# Patient Record
Sex: Female | Born: 1965 | Race: Black or African American | Hispanic: No | Smoking: Former smoker
Health system: Southern US, Community
[De-identification: ages and names within clinical notes are randomized; demographics above are authoritative.]

## PROBLEM LIST (undated history)

## (undated) DIAGNOSIS — D649 Anemia, unspecified: Secondary | ICD-10-CM

## (undated) DIAGNOSIS — L309 Dermatitis, unspecified: Secondary | ICD-10-CM

## (undated) DIAGNOSIS — D332 Benign neoplasm of brain, unspecified: Secondary | ICD-10-CM

## (undated) DIAGNOSIS — R51 Headache: Secondary | ICD-10-CM

## (undated) DIAGNOSIS — K219 Gastro-esophageal reflux disease without esophagitis: Secondary | ICD-10-CM

## (undated) DIAGNOSIS — I10 Essential (primary) hypertension: Secondary | ICD-10-CM

## (undated) DIAGNOSIS — R011 Cardiac murmur, unspecified: Secondary | ICD-10-CM

## (undated) HISTORY — PX: WISDOM TOOTH EXTRACTION: SHX21

## (undated) HISTORY — PX: ABDOMINAL HYSTERECTOMY: SHX81

---

## 1997-12-11 ENCOUNTER — Other Ambulatory Visit: Admission: RE | Admit: 1997-12-11 | Discharge: 1997-12-11 | Payer: Self-pay | Admitting: Obstetrics and Gynecology

## 1999-11-19 ENCOUNTER — Other Ambulatory Visit: Admission: RE | Admit: 1999-11-19 | Discharge: 1999-11-19 | Payer: Self-pay | Admitting: *Deleted

## 2000-01-21 HISTORY — PX: TUBAL LIGATION: SHX77

## 2000-09-18 ENCOUNTER — Encounter: Admission: RE | Admit: 2000-09-18 | Discharge: 2000-12-17 | Payer: Self-pay | Admitting: *Deleted

## 2000-12-02 ENCOUNTER — Encounter (HOSPITAL_COMMUNITY): Admission: RE | Admit: 2000-12-02 | Discharge: 2000-12-18 | Payer: Self-pay | Admitting: Obstetrics and Gynecology

## 2000-12-17 ENCOUNTER — Inpatient Hospital Stay (HOSPITAL_COMMUNITY): Admission: AD | Admit: 2000-12-17 | Discharge: 2000-12-20 | Payer: Self-pay | Admitting: Obstetrics and Gynecology

## 2000-12-28 ENCOUNTER — Encounter: Payer: Self-pay | Admitting: Obstetrics and Gynecology

## 2000-12-28 ENCOUNTER — Ambulatory Visit (HOSPITAL_COMMUNITY): Admission: RE | Admit: 2000-12-28 | Discharge: 2000-12-28 | Payer: Self-pay | Admitting: Obstetrics and Gynecology

## 2001-01-15 ENCOUNTER — Other Ambulatory Visit: Admission: RE | Admit: 2001-01-15 | Discharge: 2001-01-15 | Payer: Self-pay | Admitting: Obstetrics and Gynecology

## 2001-02-04 ENCOUNTER — Ambulatory Visit (HOSPITAL_COMMUNITY): Admission: RE | Admit: 2001-02-04 | Discharge: 2001-02-04 | Payer: Self-pay | Admitting: Obstetrics and Gynecology

## 2002-01-04 ENCOUNTER — Other Ambulatory Visit: Admission: RE | Admit: 2002-01-04 | Discharge: 2002-01-04 | Payer: Self-pay | Admitting: Obstetrics and Gynecology

## 2002-10-04 ENCOUNTER — Ambulatory Visit (HOSPITAL_COMMUNITY): Admission: RE | Admit: 2002-10-04 | Discharge: 2002-10-04 | Payer: Self-pay | Admitting: Obstetrics and Gynecology

## 2002-10-04 ENCOUNTER — Encounter: Payer: Self-pay | Admitting: Obstetrics and Gynecology

## 2003-01-16 ENCOUNTER — Other Ambulatory Visit: Admission: RE | Admit: 2003-01-16 | Discharge: 2003-01-16 | Payer: Self-pay | Admitting: Obstetrics and Gynecology

## 2003-04-07 ENCOUNTER — Other Ambulatory Visit: Admission: RE | Admit: 2003-04-07 | Discharge: 2003-04-07 | Payer: Self-pay | Admitting: Family Medicine

## 2003-06-07 ENCOUNTER — Ambulatory Visit: Admission: RE | Admit: 2003-06-07 | Discharge: 2003-06-07 | Payer: Self-pay | Admitting: Family Medicine

## 2003-06-10 ENCOUNTER — Ambulatory Visit (HOSPITAL_COMMUNITY): Admission: RE | Admit: 2003-06-10 | Discharge: 2003-06-10 | Payer: Self-pay | Admitting: Neurological Surgery

## 2004-01-30 ENCOUNTER — Other Ambulatory Visit: Admission: RE | Admit: 2004-01-30 | Discharge: 2004-01-30 | Payer: Self-pay | Admitting: Obstetrics and Gynecology

## 2004-02-06 ENCOUNTER — Ambulatory Visit (HOSPITAL_COMMUNITY): Admission: RE | Admit: 2004-02-06 | Discharge: 2004-02-06 | Payer: Self-pay | Admitting: Obstetrics and Gynecology

## 2004-03-06 ENCOUNTER — Encounter: Admission: RE | Admit: 2004-03-06 | Discharge: 2004-03-06 | Payer: Self-pay | Admitting: Obstetrics and Gynecology

## 2004-12-24 ENCOUNTER — Other Ambulatory Visit: Admission: RE | Admit: 2004-12-24 | Discharge: 2004-12-24 | Payer: Self-pay | Admitting: Obstetrics and Gynecology

## 2005-02-06 ENCOUNTER — Encounter: Admission: RE | Admit: 2005-02-06 | Discharge: 2005-02-06 | Payer: Self-pay | Admitting: Obstetrics and Gynecology

## 2005-02-25 ENCOUNTER — Encounter: Admission: RE | Admit: 2005-02-25 | Discharge: 2005-02-25 | Payer: Self-pay | Admitting: Obstetrics and Gynecology

## 2005-12-04 ENCOUNTER — Other Ambulatory Visit: Admission: RE | Admit: 2005-12-04 | Discharge: 2005-12-04 | Payer: Self-pay | Admitting: Obstetrics and Gynecology

## 2007-04-20 ENCOUNTER — Ambulatory Visit (HOSPITAL_COMMUNITY): Admission: RE | Admit: 2007-04-20 | Discharge: 2007-04-20 | Payer: Self-pay | Admitting: Neurological Surgery

## 2007-08-25 ENCOUNTER — Ambulatory Visit: Payer: Self-pay | Admitting: Internal Medicine

## 2007-08-25 LAB — CONVERTED CEMR LAB
AST: 23 units/L (ref 0–37)
Alkaline Phosphatase: 68 units/L (ref 39–117)
BUN: 10 mg/dL (ref 6–23)
Calcium: 9.5 mg/dL (ref 8.4–10.5)
Creatinine, Ser: 0.76 mg/dL (ref 0.40–1.20)
Potassium: 3.8 meq/L (ref 3.5–5.3)
T3 Uptake Ratio: 38.7 % — ABNORMAL HIGH (ref 22.5–37.0)
Total Bilirubin: 0.9 mg/dL (ref 0.3–1.2)
Total Protein: 6.4 g/dL (ref 6.0–8.3)

## 2008-10-20 ENCOUNTER — Encounter: Admission: RE | Admit: 2008-10-20 | Discharge: 2008-10-20 | Payer: Self-pay | Admitting: Obstetrics & Gynecology

## 2009-04-11 ENCOUNTER — Encounter: Admission: RE | Admit: 2009-04-11 | Discharge: 2009-04-11 | Payer: Self-pay | Admitting: Neurological Surgery

## 2009-09-28 ENCOUNTER — Emergency Department (HOSPITAL_COMMUNITY): Admission: EM | Admit: 2009-09-28 | Discharge: 2009-09-28 | Payer: Self-pay | Admitting: Emergency Medicine

## 2009-11-27 ENCOUNTER — Encounter: Admission: RE | Admit: 2009-11-27 | Discharge: 2009-11-27 | Payer: Self-pay | Admitting: Family Medicine

## 2010-02-10 ENCOUNTER — Encounter: Payer: Self-pay | Admitting: Obstetrics and Gynecology

## 2010-02-10 ENCOUNTER — Encounter: Payer: Self-pay | Admitting: Internal Medicine

## 2010-06-07 NOTE — Op Note (Signed)
[facility]  Patient:    Ashley [name], Ashley [name] Visit Number: [id number] MRN: [id number]          Service Type: DSU Location: [facility] Attending Physician:  Wandalee Ferdinand Dictated by:   Rudy Jew Ashley Royalty, M.D. Proc. Date: 02/04/01 Admit Date:  02/04/2001                             Operative Report  PREOPERATIVE DIAGNOSIS:       Desire for attempt at laparoscopic bilateral tubal sterilization.  POSTOPERATIVE DIAGNOSIS:      1. Desire for attempt at laparoscopic bilateral                                  tubal sterilization.                               2. Adhesion of the distal fallopian tube to                                  appendices epiploica of the colon.  OPERATION:                    1. Laparoscopic bilateral tubal sterilization                                  (Falope).                               2. Lysis of adhesions.  SURGEON:                      Rudy Jew. Ashley Royalty, M.D.  ANESTHESIA:                   General.  ESTIMATED BLOOD LOSS:         Less than 25 cc.  COMPLICATIONS:                None.  PACKS AND DRAINS:             None.  DESCRIPTION OF PROCEDURE:     The patient was taken to the operating room and placed in the dorsosupine position.  After adequate general anesthesia was administered, she was placed in lithotomy position and prepped and draped in the usual manner for abdominal and vaginal surgery.  Posterior weighted retractor was placed per vagina.  The anterior lip of the cervix was grasped with a single-tooth tenaculum.  Charcot uterine manipulator was placed per cervix.  Bladder was drained with a red rubber catheter.  A 1.5 cm infraumbilical incision was made in the transverse plane.  Disposable size 10-11 laparoscopic trocar was placed into the abdominal cavity.  Its location was verified by placement of the laparoscope.  There was no evidence of any trauma.  Pneumoperitoneum was then created and maintained  with CO2.  Next, the Falope ring trocar was introduced into the abdominal cavity in the suprapubic area in the midline.  Transillumination and direct visualization techniques were used.  The pelvis was thoroughly surveyed.  The uterus was normal size, shape, and contour without evidence  of any fibroids or endometriosis.  The left and right fallopian tubes were normal size, shape, contour, and length with luxuriant fimbria.  There were adhesions from the right fimbria to the appendices epiploica of the bowel.  The ovaries were normal size, shape, and contour bilaterally without evidence of any significant cyst or endometriosis. The remainder of the peritoneal surfaces were smooth and glistening. Appropriate photos were obtained.  The right fallopian tube was grasped in the distal isthmus proximal ampullary portion.  A Falope ring was applied without difficulty.  Excellent knuckle of tube was noted to be contained within the ring.  Excellent blanching tissue was noted.  The left fallopian tube was grasped in the distal isthmus proximal ampullary portion.  A Falope ring was applied.  An excellent knuckle of tube was noted to be contained within the ring.  Excellent blanching of tissue was noted.  Using the bipolar cautery and the scissors, the aforementioned adhesion was successfully lysed.  Hemostasis was noted.  The abdominal instruments were then removed.  Pneumoperitoneum was evacuated. Fascial defects were closed with 0 Vicryl in interrupted fashion.  The skin was closed with Dermabond.  Vaginal instruments were removed.  Hemostasis was noted.  The procedure was terminated.  The patient was taken to the recovery room in excellent condition. Dictated by:   Rudy Jew Ashley Royalty, M.D. Attending Physician:  Wandalee Ferdinand DD:  02/04/01 TD:  02/05/01 Job: [id number]

## 2010-06-07 NOTE — Discharge Summary (Signed)
[facility]  Patient:    Ashley [name], Ashley [name] Visit Number: [id number] MRN: [id number]          Service Type: DSU Location: [facility] Attending Physician:  Wandalee Ferdinand Dictated by:   Rudy Jew Ashley Royalty, M.D. Admit Date:  02/04/2001 Discharge Date: 02/04/2001                             Discharge Summary  DISCHARGE DIAGNOSES:          1. Intrauterine pregnancy at term, delivered.                               2. Premature rupture of membranes.                               3. Gestational diabetes.                               4. Advanced maternal age.  OPERATIONS/PROCEDURES:        OB delivery.  CONSULTATIONS:                None.  DISCHARGE MEDICATIONS:        Motrin 800 mg t.i.d. p.r.n.  HISTORY AND PHYSICAL:         This is a 45 year old gravida 3, para 1, AB 1 at [redacted] weeks gestation.  Prenatal care was complicated by the aforementioned risk factors.  The patient transferred at 36 weeks from [facility] ([facility]) to [facility].  She was disgruntled with the care there.  The patient presented with premature rupture of membranes at approximately 4 p.m. on the day of admission.  Contractions ensued.  She was noted to be group B Strep positive.  HOSPITAL COURSE:              The patient was admitted to [facility] [place].  Initial laboratory studies were drawn.  Initial cervical examination revealed the cervix to be 3-cm dilated, 50% effaced, -3 station, and a vertex presentation.  Pitocin was administered.  She went on to labor and deliver a 7 pound 9 ounce female, Apgars 9 at 1 minute and 9 at 5 minutes, and sent to the newborn nursery.  Delivery was accomplished by Dr. Lonell Face over an intact perineum.  There was a small first-degree anterior vaginal laceration which was repaired without difficulty.  The patients postpartum course was benign.  She was discharged on the second postpartum  day afebrile and in satisfactory condition.  ACCESSORY CLINICAL FINDINGS:  Hemoglobin and hematocrit on admission were 12.9 and 59.3, respectively.  Repeat labs obtained December 19, 2000 revealed values of 12.7 and 37.8, respectively.  DISPOSITION:                  The patient is to return to [facility] in 4-6 weeks for postpartum evaluation. Dictated by:   Rudy Jew Ashley Royalty, M.D. Attending Physician:  Wandalee Ferdinand DD:  02/15/01 TD:  02/16/01 Job: [id number]

## 2010-06-07 NOTE — H&P (Signed)
[facility]  Patient:    Ashley [name], Ashley [name] Visit Number: [id number] MRN: [id number]          Service Type: DSU Location: [facility] Attending Physician:  Wandalee Ferdinand Dictated by:   Rudy Jew Ashley Royalty, M.D. Admit Date:  02/04/2001                           History and Physical  HISTORY OF PRESENT ILLNESS:   This is a 45 year old gravida 2, para 2, who states a desire for attempt at permanent surgical sterilization.  MEDICATIONS:                  None.  PAST MEDICAL HISTORY:         Medical: Negative.  Surgical: Negative.  ALLERGIES:                    Negative.  FAMILY HISTORY:               Positive for hypertension and diabetes.  SOCIAL HISTORY:               The patient denies the use of tobacco or significant alcohol.  REVIEW OF SYSTEMS:            Noncontributory.  PHYSICAL EXAMINATION:  GENERAL:                      A well-developed, well-nourished, pleasant black female in no acute distress.  VITAL SIGNS:                  Afebrile, vital signs stable.  SKIN:                         Warm and dry, without lesions.  LYMPHS:                       There is no supraclavicular, cervical or inguinal adenopathy.  HEENT:                        Normocephalic.  NECK:                         Supple, without thyromegaly.  CHEST AND LUNGS:              Clear.  CARDIAC:                      Regular rate and rhythm, without murmurs, gallops or rubs.  BREAST EXAM:                  Deferred.  ABDOMEN:                      Soft and nontender, without masses or organomegaly.  Bowel sounds are active.  MUSCULOSKELETAL EXAMINATION:  Reveals full range of motion without edema, cyanosis or CVA tenderness.  PELVIC EXAMINATION:           Deferred until examination under anesthesia.  IMPRESSION:                   Desire for attempt at permanent surgical sterilization.  PLAN:                         Laparoscopic bilateral tubal  sterilization procedure.  The risks, benefits, complications and alternatives fully discussed with the patient.  The permanency and failure rates of various techniques including, but not limited to, Falope ring, bipolar cautery, mini-laparotomy with partial salpingectomy discussed and accepted.  Questions invited and answered. Dictated by:   Rudy Jew Ashley Royalty, M.D. Attending Physician:  Wandalee Ferdinand DD:  02/04/01 TD:  02/04/01 Job: [id number]

## 2010-10-14 LAB — CREATININE, SERUM: GFR calc non Af Amer: >60

## 2010-10-22 ENCOUNTER — Other Ambulatory Visit: Payer: Self-pay | Admitting: Obstetrics and Gynecology

## 2010-10-22 DIAGNOSIS — Z1231 Encounter for screening mammogram for malignant neoplasm of breast: Secondary | ICD-10-CM

## 2010-11-14 ENCOUNTER — Other Ambulatory Visit: Payer: Self-pay | Admitting: Neurology

## 2010-11-14 DIAGNOSIS — R0989 Other specified symptoms and signs involving the circulatory and respiratory systems: Secondary | ICD-10-CM

## 2010-11-15 ENCOUNTER — Ambulatory Visit
Admission: RE | Admit: 2010-11-15 | Discharge: 2010-11-15 | Disposition: A | Discharge status: Home / Self Care | Payer: Medicare Other | Source: Ambulatory Visit | Attending: Neurology | Admitting: Neurology

## 2010-11-15 DIAGNOSIS — R0989 Other specified symptoms and signs involving the circulatory and respiratory systems: Secondary | ICD-10-CM

## 2010-11-29 ENCOUNTER — Ambulatory Visit
Admission: RE | Admit: 2010-11-29 | Discharge: 2010-11-29 | Disposition: A | Discharge status: Home / Self Care | Payer: Medicare Other | Source: Ambulatory Visit | Attending: Obstetrics and Gynecology | Admitting: Obstetrics and Gynecology

## 2010-11-29 DIAGNOSIS — Z1231 Encounter for screening mammogram for malignant neoplasm of breast: Secondary | ICD-10-CM

## 2010-12-02 ENCOUNTER — Other Ambulatory Visit: Payer: Self-pay | Admitting: Neurological Surgery

## 2010-12-02 DIAGNOSIS — R51 Headache: Secondary | ICD-10-CM

## 2010-12-09 ENCOUNTER — Ambulatory Visit
Admission: RE | Admit: 2010-12-09 | Discharge: 2010-12-09 | Disposition: A | Discharge status: Home / Self Care | Payer: Medicare Other | Source: Ambulatory Visit | Attending: Neurological Surgery | Admitting: Neurological Surgery

## 2010-12-09 DIAGNOSIS — R51 Headache: Secondary | ICD-10-CM

## 2010-12-09 MED ORDER — GADOBENATE DIMEGLUMINE 529 MG/ML IV SOLN
9.0000 mL | Freq: Once | INTRAVENOUS | Status: AC | PRN
  Administered 2010-12-09: 9 mL via INTRAVENOUS

## 2011-07-30 ENCOUNTER — Encounter (HOSPITAL_COMMUNITY): Payer: Self-pay | Admitting: Pharmacy Technician

## 2011-07-31 ENCOUNTER — Other Ambulatory Visit: Payer: Self-pay | Admitting: Obstetrics and Gynecology

## 2011-08-01 ENCOUNTER — Encounter (HOSPITAL_COMMUNITY): Payer: Self-pay

## 2011-08-01 ENCOUNTER — Other Ambulatory Visit: Payer: Self-pay

## 2011-08-01 ENCOUNTER — Encounter (HOSPITAL_COMMUNITY)
Admission: RE | Admit: 2011-08-01 | Discharge: 2011-08-01 | Disposition: A | Discharge status: Home / Self Care | Payer: Medicare Other | Source: Ambulatory Visit | Attending: Obstetrics and Gynecology | Admitting: Obstetrics and Gynecology

## 2011-08-01 HISTORY — DX: Dermatitis, unspecified: L30.9

## 2011-08-01 HISTORY — DX: Cardiac murmur, unspecified: R01.1

## 2011-08-01 HISTORY — DX: Anemia, unspecified: D64.9

## 2011-08-01 HISTORY — DX: Essential (primary) hypertension: I10

## 2011-08-01 HISTORY — DX: Gastro-esophageal reflux disease without esophagitis: K21.9

## 2011-08-01 HISTORY — DX: Headache: R51

## 2011-08-01 LAB — CBC WITH DIFFERENTIAL/PLATELET
HCT: 38.7 % (ref 36.0–46.0)
Hemoglobin: 12.5 g/dL (ref 12.0–15.0)
Lymphs Abs: 2.9 10*3/uL (ref 0.7–4.0)
MCHC: 32.3 g/dL (ref 30.0–36.0)
Monocytes Absolute: 0.4 10*3/uL (ref 0.1–1.0)
Monocytes Relative: 6 % (ref 3–12)
Neutro Abs: 3.1 10*3/uL (ref 1.7–7.7)
Neutrophils Relative %: 48 % (ref 43–77)
RBC: 5.27 MIL/uL — ABNORMAL HIGH (ref 3.87–5.11)

## 2011-08-01 LAB — COMPREHENSIVE METABOLIC PANEL
ALT: 20 U/L (ref 0–35)
Albumin: 3.7 g/dL (ref 3.5–5.2)
Alkaline Phosphatase: 103 U/L (ref 39–117)
Calcium: 8.9 mg/dL (ref 8.4–10.5)
GFR calc Af Amer: >90 mL/min (ref >90)
Potassium: 3.9 mEq/L (ref 3.5–5.1)
Sodium: 140 mEq/L (ref 135–145)
Total Protein: 6.5 g/dL (ref 6.0–8.3)

## 2011-08-01 LAB — URINALYSIS, ROUTINE W REFLEX MICROSCOPIC
Bilirubin Urine: NEGATIVE
Glucose, UA: NEGATIVE mg/dL
Ketones, ur: NEGATIVE mg/dL
Leukocytes, UA: NEGATIVE
Nitrite: NEGATIVE
Specific Gravity, Urine: 1.015 (ref 1.005–1.030)
pH: 7 (ref 5.0–8.0)

## 2011-08-01 LAB — SURGICAL PCR SCREEN
MRSA, PCR: NEGATIVE
Staphylococcus aureus: NEGATIVE

## 2011-08-01 NOTE — Patient Instructions (Addendum)
   Your procedure is scheduled on: Monday, July 15th  Enter through the Main Entrance of [facility] at: 8 am Pick up the phone at the desk and dial [phone] and inform us of your arrival.  Please call this number if you have any problems the morning of surgery: [phone]  Remember: Do not eat food after midnight: Sunday Do not drink clear liquids after: Sunday Take these medicines the morning of surgery with a SIP OF WATER:  BP meds, Zomig, Nexium  Do not wear jewelry, make-up, or FINGER nail polish No metal in your hair or on your body. Do not wear lotions, powders, perfumes or deodorant. Do not shave 48 hours prior to surgery. Do not bring valuables to the hospital. Contacts, dentures or bridgework may not be worn into surgery.  Leave suitcase in the car. After Surgery it may be brought to your room. For patients being admitted to the hospital, checkout time is 11:00am the day of discharge. Home with son Wyndi Northrup  cell [phone].  Patients discharged on the day of surgery will not be allowed to drive home.     Remember to use your hibiclens as instructed.Please shower with 1/2 bottle the evening before your surgery and the other 1/2 bottle the morning of surgery. Neck down avoiding private area.

## 2011-08-03 ENCOUNTER — Other Ambulatory Visit: Payer: Self-pay | Admitting: Obstetrics and Gynecology

## 2011-08-03 MED ORDER — GENTAMICIN SULFATE 40 MG/ML IJ SOLN: Freq: Once | INTRAVENOUS | Status: DC

## 2011-08-04 ENCOUNTER — Ambulatory Visit (HOSPITAL_COMMUNITY)
Admission: RE | Admit: 2011-08-04 | Discharge: 2011-08-05 | Disposition: A | Discharge status: Home / Self Care | Payer: Medicare Other | Source: Ambulatory Visit | Attending: Obstetrics and Gynecology | Admitting: Obstetrics and Gynecology

## 2011-08-04 ENCOUNTER — Encounter (HOSPITAL_COMMUNITY): Payer: Self-pay | Admitting: Anesthesiology

## 2011-08-04 ENCOUNTER — Inpatient Hospital Stay (HOSPITAL_COMMUNITY): Payer: Medicare Other | Admitting: Anesthesiology

## 2011-08-04 ENCOUNTER — Encounter (HOSPITAL_COMMUNITY)
Admission: RE | Disposition: A | Discharge status: Home / Self Care | Payer: Self-pay | Source: Ambulatory Visit | Attending: Obstetrics and Gynecology

## 2011-08-04 ENCOUNTER — Encounter (HOSPITAL_COMMUNITY): Payer: Self-pay

## 2011-08-04 DIAGNOSIS — Z8041 Family history of malignant neoplasm of ovary: Secondary | ICD-10-CM | POA: Insufficient documentation

## 2011-08-04 DIAGNOSIS — N8000 Endometriosis of the uterus, unspecified: Secondary | ICD-10-CM | POA: Insufficient documentation

## 2011-08-04 DIAGNOSIS — N949 Unspecified condition associated with female genital organs and menstrual cycle: Secondary | ICD-10-CM | POA: Insufficient documentation

## 2011-08-04 DIAGNOSIS — D219 Benign neoplasm of connective and other soft tissue, unspecified: Secondary | ICD-10-CM

## 2011-08-04 DIAGNOSIS — N946 Dysmenorrhea, unspecified: Secondary | ICD-10-CM | POA: Insufficient documentation

## 2011-08-04 DIAGNOSIS — N938 Other specified abnormal uterine and vaginal bleeding: Secondary | ICD-10-CM | POA: Insufficient documentation

## 2011-08-04 DIAGNOSIS — N8300 Follicular cyst of ovary, unspecified side: Secondary | ICD-10-CM | POA: Insufficient documentation

## 2011-08-04 DIAGNOSIS — Z01818 Encounter for other preprocedural examination: Secondary | ICD-10-CM | POA: Insufficient documentation

## 2011-08-04 DIAGNOSIS — Z01812 Encounter for preprocedural laboratory examination: Secondary | ICD-10-CM | POA: Insufficient documentation

## 2011-08-04 DIAGNOSIS — D251 Intramural leiomyoma of uterus: Principal | ICD-10-CM | POA: Insufficient documentation

## 2011-08-04 DIAGNOSIS — D252 Subserosal leiomyoma of uterus: Secondary | ICD-10-CM | POA: Insufficient documentation

## 2011-08-04 DIAGNOSIS — N831 Corpus luteum cyst of ovary, unspecified side: Secondary | ICD-10-CM | POA: Insufficient documentation

## 2011-08-04 HISTORY — PX: LAPAROSCOPIC ASSISTED VAGINAL HYSTERECTOMY: SHX5398

## 2011-08-04 LAB — URINALYSIS, ROUTINE W REFLEX MICROSCOPIC
Bilirubin Urine: NEGATIVE
Hgb urine dipstick: NEGATIVE
Ketones, ur: NEGATIVE mg/dL
Nitrite: NEGATIVE
Urobilinogen, UA: 0.2 mg/dL (ref 0.0–1.0)
pH: 6 (ref 5.0–8.0)

## 2011-08-04 LAB — PREGNANCY, URINE: Preg Test, Ur: NEGATIVE

## 2011-08-04 SURGERY — HYSTERECTOMY, VAGINAL, LAPAROSCOPY-ASSISTED
Anesthesia: General | Site: Abdomen | Wound class: Clean Contaminated

## 2011-08-04 MED ORDER — LIDOCAINE HCL (CARDIAC) 20 MG/ML IV SOLN
INTRAVENOUS | Status: AC
  Filled 2011-08-04: qty 5

## 2011-08-04 MED ORDER — FENTANYL CITRATE 0.05 MG/ML IJ SOLN
INTRAMUSCULAR | Status: AC
  Filled 2011-08-04: qty 5

## 2011-08-04 MED ORDER — GLYCOPYRROLATE 0.2 MG/ML IJ SOLN
INTRAMUSCULAR | Status: DC | PRN
  Administered 2011-08-04: 0.2 mg via INTRAVENOUS
  Administered 2011-08-04: .6 mg via INTRAVENOUS

## 2011-08-04 MED ORDER — DIPHENHYDRAMINE HCL 12.5 MG/5ML PO ELIX
12.5000 mg | ORAL_SOLUTION | Freq: Four times a day (QID) | ORAL | Status: DC | PRN
  Administered 2011-08-05: 12.5 mg via ORAL
  Filled 2011-08-04: qty 5

## 2011-08-04 MED ORDER — HYDROMORPHONE 0.3 MG/ML IV SOLN
INTRAVENOUS | Status: DC
  Administered 2011-08-04: 10.67 mg via INTRAVENOUS
  Administered 2011-08-04: 3.19 mg via INTRAVENOUS
  Administered 2011-08-04: 15:00:00 via INTRAVENOUS
  Administered 2011-08-05: 0.399 mg via INTRAVENOUS
  Filled 2011-08-04: qty 25

## 2011-08-04 MED ORDER — MIDAZOLAM HCL 2 MG/2ML IJ SOLN
INTRAMUSCULAR | Status: AC
  Filled 2011-08-04: qty 2

## 2011-08-04 MED ORDER — FENTANYL CITRATE 0.05 MG/ML IJ SOLN
INTRAMUSCULAR | Status: DC | PRN
  Administered 2011-08-04: 50 ug via INTRAVENOUS
  Administered 2011-08-04 (×2): 100 ug via INTRAVENOUS

## 2011-08-04 MED ORDER — VASOPRESSIN 20 UNIT/ML IJ SOLN
INTRAMUSCULAR | Status: AC
  Filled 2011-08-04: qty 1

## 2011-08-04 MED ORDER — NALOXONE HCL 0.4 MG/ML IJ SOLN: 0.4000 mg | INTRAMUSCULAR | Status: DC | PRN

## 2011-08-04 MED ORDER — GENTAMICIN SULFATE 40 MG/ML IJ SOLN
Freq: Once | INTRAVENOUS | Status: AC
  Administered 2011-08-04: 100 mL via INTRAVENOUS
  Filled 2011-08-04: qty 8.38

## 2011-08-04 MED ORDER — DEXAMETHASONE SODIUM PHOSPHATE 10 MG/ML IJ SOLN
INTRAMUSCULAR | Status: AC
  Filled 2011-08-04: qty 1

## 2011-08-04 MED ORDER — ONDANSETRON HCL 4 MG/2ML IJ SOLN
INTRAMUSCULAR | Status: AC
Start: 2011-08-04 — End: 2011-08-04
  Filled 2011-08-04: qty 2

## 2011-08-04 MED ORDER — ROCURONIUM BROMIDE 100 MG/10ML IV SOLN
INTRAVENOUS | Status: DC | PRN
  Administered 2011-08-04: 35 mg via INTRAVENOUS
  Administered 2011-08-04: 5 mg via INTRAVENOUS
  Administered 2011-08-04: 10 mg via INTRAVENOUS
  Administered 2011-08-04: 5 mg via INTRAVENOUS

## 2011-08-04 MED ORDER — LACTATED RINGERS IV SOLN
INTRAVENOUS | Status: DC
  Administered 2011-08-04 (×3): via INTRAVENOUS

## 2011-08-04 MED ORDER — MIDAZOLAM HCL 5 MG/5ML IJ SOLN
INTRAMUSCULAR | Status: DC | PRN
  Administered 2011-08-04: 2 mg via INTRAVENOUS

## 2011-08-04 MED ORDER — PROPOFOL 10 MG/ML IV EMUL
INTRAVENOUS | Status: AC
Start: 2011-08-04 — End: 2011-08-04
  Filled 2011-08-04: qty 20

## 2011-08-04 MED ORDER — ONDANSETRON HCL 4 MG/2ML IJ SOLN: 4.0000 mg | Freq: Four times a day (QID) | INTRAMUSCULAR | Status: DC | PRN

## 2011-08-04 MED ORDER — DIPHENHYDRAMINE HCL 50 MG/ML IJ SOLN: 12.5000 mg | Freq: Four times a day (QID) | INTRAMUSCULAR | Status: DC | PRN

## 2011-08-04 MED ORDER — CLINDAMYCIN PHOSPHATE 900 MG/50ML IV SOLN
900.0000 mg | Freq: Three times a day (TID) | INTRAVENOUS | Status: AC
  Administered 2011-08-04 – 2011-08-05 (×3): 900 mg via INTRAVENOUS
  Filled 2011-08-04 (×3): qty 50

## 2011-08-04 MED ORDER — NEOSTIGMINE METHYLSULFATE 1 MG/ML IJ SOLN
INTRAMUSCULAR | Status: DC | PRN
  Administered 2011-08-04: 2.5 mg via INTRAVENOUS

## 2011-08-04 MED ORDER — NEOSTIGMINE METHYLSULFATE 1 MG/ML IJ SOLN
INTRAMUSCULAR | Status: AC
  Filled 2011-08-04: qty 10

## 2011-08-04 MED ORDER — LACTATED RINGERS IR SOLN
Status: DC | PRN
  Administered 2011-08-04: 3000 mL

## 2011-08-04 MED ORDER — LIDOCAINE HCL (CARDIAC) 20 MG/ML IV SOLN
INTRAVENOUS | Status: DC | PRN
  Administered 2011-08-04: 50 mg via INTRAVENOUS

## 2011-08-04 MED ORDER — HYDROMORPHONE HCL PF 1 MG/ML IJ SOLN
INTRAMUSCULAR | Status: AC
  Administered 2011-08-04: 0.5 mg via INTRAVENOUS
  Filled 2011-08-04: qty 1

## 2011-08-04 MED ORDER — LACTATED RINGERS IV SOLN
INTRAVENOUS | Status: DC
  Administered 2011-08-04 – 2011-08-05 (×2): via INTRAVENOUS

## 2011-08-04 MED ORDER — HYDROMORPHONE HCL PF 1 MG/ML IJ SOLN
INTRAMUSCULAR | Status: AC
  Filled 2011-08-04: qty 1

## 2011-08-04 MED ORDER — PROPOFOL 10 MG/ML IV EMUL
INTRAVENOUS | Status: DC | PRN
  Administered 2011-08-04: 150 mg via INTRAVENOUS

## 2011-08-04 MED ORDER — HEPARIN SODIUM (PORCINE) 5000 UNIT/ML IJ SOLN
5000.0000 [IU] | INTRAMUSCULAR | Status: DC
  Administered 2011-08-04: 5000 [IU] via SUBCUTANEOUS

## 2011-08-04 MED ORDER — VASOPRESSIN 20 UNIT/ML IJ SOLN
INTRAVENOUS | Status: DC | PRN
  Administered 2011-08-04: 11:00:00

## 2011-08-04 MED ORDER — SODIUM CHLORIDE 0.9 % IJ SOLN: 9.0000 mL | INTRAMUSCULAR | Status: DC | PRN

## 2011-08-04 MED ORDER — HEPARIN SODIUM (PORCINE) 5000 UNIT/ML IJ SOLN
5000.0000 [IU] | Freq: Three times a day (TID) | INTRAMUSCULAR | Status: DC
  Administered 2011-08-05 (×2): 5000 [IU] via SUBCUTANEOUS
  Filled 2011-08-04 (×6): qty 1

## 2011-08-04 MED ORDER — ROCURONIUM BROMIDE 50 MG/5ML IV SOLN
INTRAVENOUS | Status: AC
  Filled 2011-08-04: qty 1

## 2011-08-04 MED ORDER — DEXAMETHASONE SODIUM PHOSPHATE 10 MG/ML IJ SOLN
INTRAMUSCULAR | Status: DC | PRN
  Administered 2011-08-04: 10 mg via INTRAVENOUS

## 2011-08-04 MED ORDER — OXYCODONE-ACETAMINOPHEN 5-325 MG PO TABS: 1.0000 | ORAL_TABLET | Freq: Four times a day (QID) | ORAL | Status: DC | PRN

## 2011-08-04 MED ORDER — METHYLENE BLUE 1 % INJ SOLN
INTRAMUSCULAR | Status: AC
  Filled 2011-08-04: qty 1

## 2011-08-04 MED ORDER — FENTANYL CITRATE 0.05 MG/ML IJ SOLN
INTRAMUSCULAR | Status: AC
Start: 2011-08-04 — End: 2011-08-04
  Filled 2011-08-04: qty 5

## 2011-08-04 MED ORDER — HYDROMORPHONE HCL PF 1 MG/ML IJ SOLN
0.2500 mg | INTRAMUSCULAR | Status: DC | PRN
  Administered 2011-08-04: 0.5 mg via INTRAVENOUS

## 2011-08-04 MED ORDER — BACITRACIN-NEOMYCIN-POLYMYXIN 400-5-5000 EX OINT
TOPICAL_OINTMENT | CUTANEOUS | Status: AC
  Filled 2011-08-04: qty 1

## 2011-08-04 MED ORDER — ONDANSETRON HCL 4 MG/2ML IJ SOLN
INTRAMUSCULAR | Status: DC | PRN
  Administered 2011-08-04: 4 mg via INTRAVENOUS

## 2011-08-04 MED ORDER — HEPARIN SODIUM (PORCINE) 5000 UNIT/ML IJ SOLN: 5000.0000 [IU] | INTRAMUSCULAR | Status: DC

## 2011-08-04 MED ORDER — GLYCOPYRROLATE 0.2 MG/ML IJ SOLN
INTRAMUSCULAR | Status: AC
  Filled 2011-08-04: qty 1

## 2011-08-04 MED ORDER — HEPARIN SODIUM (PORCINE) 5000 UNIT/ML IJ SOLN
INTRAMUSCULAR | Status: AC
  Administered 2011-08-04: 5000 [IU] via SUBCUTANEOUS
  Filled 2011-08-04: qty 1

## 2011-08-04 MED ORDER — ONDANSETRON HCL 4 MG PO TABS: 4.0000 mg | ORAL_TABLET | Freq: Four times a day (QID) | ORAL | Status: DC | PRN

## 2011-08-04 MED ORDER — HYDROMORPHONE HCL PF 1 MG/ML IJ SOLN
INTRAMUSCULAR | Status: DC | PRN
  Administered 2011-08-04: 1 mg via INTRAVENOUS

## 2011-08-04 SURGICAL SUPPLY — 58 items
APL SKNCLS STERI-STRIP NONHPOA (GAUZE/BANDAGES/DRESSINGS) ×3
BAG DECANTER FOR FLEXI CONT (MISCELLANEOUS) ×4 IMPLANT
BENZOIN TINCTURE PRP APPL 2/3 (GAUZE/BANDAGES/DRESSINGS) ×2 IMPLANT
BLADE SURG 15 STRL LF C SS BP (BLADE) ×3 IMPLANT
BLADE SURG 15 STRL SS (BLADE) ×4
CABLE HIGH FREQUENCY MONO STRZ (ELECTRODE) IMPLANT
CANISTER SUCTION 2500CC (MISCELLANEOUS) ×4 IMPLANT
CATH ROBINSON RED A/P 16FR (CATHETERS) ×4 IMPLANT
CLOTH BEACON ORANGE TIMEOUT ST (SAFETY) ×4 IMPLANT
CONT PATH 16OZ SNAP LID 3702 (MISCELLANEOUS) ×2 IMPLANT
CONT SPEC PATH 64OZ SNAP LID (MISCELLANEOUS) ×2 IMPLANT
COVER TABLE BACK 60X90 (DRAPES) ×4 IMPLANT
DECANTER SPIKE VIAL GLASS SM (MISCELLANEOUS) IMPLANT
DRAPE STERI URO 9X17 APER PCH (DRAPES) ×2 IMPLANT
DRSG COVADERM PLUS 2X2 (GAUZE/BANDAGES/DRESSINGS) ×2 IMPLANT
ELECT REM PT RETURN 9FT ADLT (ELECTROSURGICAL) ×4
ELECTRODE REM PT RTRN 9FT ADLT (ELECTROSURGICAL) ×3 IMPLANT
EVACUATOR SMOKE 8.L (FILTER) IMPLANT
FORCEPS CUTTING 33CM 5MM (CUTTING FORCEPS) ×2 IMPLANT
GAUZE SPONGE 4X4 16PLY XRAY LF (GAUZE/BANDAGES/DRESSINGS) ×2 IMPLANT
GLOVE BIO SURGEON STRL SZ7.5 (GLOVE) ×12 IMPLANT
GLOVE BIOGEL PI IND STRL 6.5 (GLOVE) ×3 IMPLANT
GLOVE BIOGEL PI INDICATOR 6.5 (GLOVE) ×1
GOWN PREVENTION PLUS LG XLONG (DISPOSABLE) ×12 IMPLANT
GOWN PREVENTION PLUS XLARGE (GOWN DISPOSABLE) ×4 IMPLANT
GOWN STRL REIN XL XLG (GOWN DISPOSABLE) ×4 IMPLANT
NDL SPNL 22GX3.5 QUINCKE BK (NEEDLE) IMPLANT
NEEDLE HYPO 22GX1.5 SAFETY (NEEDLE) IMPLANT
NEEDLE MAYO .5 CIRCLE (NEEDLE) ×4 IMPLANT
NEEDLE SPNL 22GX3.5 QUINCKE BK (NEEDLE) IMPLANT
NS IRRIG 1000ML POUR BTL (IV SOLUTION) ×4 IMPLANT
PACK LAVH (CUSTOM PROCEDURE TRAY) ×4 IMPLANT
PACK VAGINAL WOMENS (CUSTOM PROCEDURE TRAY) ×4 IMPLANT
PROTECTOR NERVE ULNAR (MISCELLANEOUS) ×4 IMPLANT
SET IRRIG TUBING LAPAROSCOPIC (IRRIGATION / IRRIGATOR) ×2 IMPLANT
SOLUTION ELECTROLUBE (MISCELLANEOUS) IMPLANT
SPONGE GAUZE 2X2 8PLY STRL LF (GAUZE/BANDAGES/DRESSINGS) ×2 IMPLANT
STRIP CLOSURE SKIN 1/4X3 (GAUZE/BANDAGES/DRESSINGS) ×4 IMPLANT
SUT PDS AB 1 CT1 36 (SUTURE) ×2 IMPLANT
SUT PLAIN 3 0 FS2 (SUTURE) ×4 IMPLANT
SUT PROLENE 0 CT 1 30 (SUTURE) ×4 IMPLANT
SUT VIC AB 0 CT1 18XCR BRD8 (SUTURE) ×8 IMPLANT
SUT VIC AB 0 CT1 27 (SUTURE) ×8
SUT VIC AB 0 CT1 27XBRD ANBCTR (SUTURE) ×6 IMPLANT
SUT VIC AB 0 CT1 36 (SUTURE) ×8 IMPLANT
SUT VIC AB 0 CT1 8-18 (SUTURE) ×8
SUT VIC AB 1 CT1 36 (SUTURE) ×4 IMPLANT
SUT VIC AB 2-0 UR6 27 (SUTURE) ×8 IMPLANT
SUT VIC AB 3-0 CT1 27 (SUTURE) ×4
SUT VIC AB 3-0 CT1 TAPERPNT 27 (SUTURE) ×1 IMPLANT
SUT VICRYL 0 TIES 12 18 (SUTURE) ×4 IMPLANT
SUT VICRYL 0 UR6 27IN ABS (SUTURE) IMPLANT
TOWEL OR 17X24 6PK STRL BLUE (TOWEL DISPOSABLE) ×8 IMPLANT
TRAY FOLEY CATH 14FR (SET/KITS/TRAYS/PACK) ×4 IMPLANT
TROCAR BALLN 12MMX100 BLUNT (TROCAR) ×2 IMPLANT
TROCAR XCEL NON-BLD 5MMX100MML (ENDOMECHANICALS) ×8 IMPLANT
TROCAR Z-THREAD FIOS 11X100 BL (TROCAR) IMPLANT
WATER STERILE IRR 1000ML POUR (IV SOLUTION) ×4 IMPLANT

## 2011-08-04 NOTE — Anesthesia Procedure Notes (Signed)
Procedure Name: Intubation Date/Time: 08/04/2011 9:45 AM Performed by: Cephus Shelling A Pre-anesthesia Checklist: Patient identified, Emergency Drugs available, Suction available, Patient being monitored and Timeout performed Patient Re-evaluated:Patient Re-evaluated prior to inductionOxygen Delivery Method: Circle system utilized Preoxygenation: Pre-oxygenation with 100% oxygen Intubation Type: IV induction Ventilation: Mask ventilation without difficulty Laryngoscope Size: Mac and 3 Grade View: Grade I Tube type: Oral Tube size: 7.0 mm Number of attempts: 1 Airway Equipment and Method: Stylet Placement Confirmation: ETT inserted through vocal cords under direct vision,  positive ETCO2 and breath sounds checked- equal and bilateral Secured at: 20 cm Tube secured with: Tape Dental Injury: Teeth and Oropharynx as per pre-operative assessment

## 2011-08-04 NOTE — Anesthesia Preprocedure Evaluation (Signed)
Anesthesia Evaluation  Patient identified by MRN, date of birth, ID band Patient awake    Reviewed: Allergy & Precautions, H&P , NPO status , Patient's Chart, lab work & pertinent test results, reviewed documented beta blocker date and time   History of Anesthesia Complications Negative for: history of anesthetic complications  Airway Mallampati: I TM Distance: >3 FB Neck ROM: full    Dental  (+) Teeth Intact Braces top and bottom:   Pulmonary neg pulmonary ROS,  breath sounds clear to auscultation  Pulmonary exam normal       Cardiovascular Exercise Tolerance: Good hypertension, On Home Beta Blockers + Valvular Problems/Murmurs (h/o heart murmur as a child, no problems as an adult) Rhythm:regular Rate:Normal     Neuro/Psych  Headaches (migraines daily), negative psych ROS   GI/Hepatic Neg liver ROS, GERD-  Medicated and Controlled,  Endo/Other  negative endocrine ROS  Renal/GU negative Renal ROS  Female GU complaint     Musculoskeletal   Abdominal   Peds  Hematology negative hematology ROS (+)   Anesthesia Other Findings   Reproductive/Obstetrics negative OB ROS                           Anesthesia Physical Anesthesia Plan  ASA: II  Anesthesia Plan: General ETT   Post-op Pain Management:    Induction:   Airway Management Planned:   Additional Equipment:   Intra-op Plan:   Post-operative Plan:   Informed Consent: I have reviewed the patients History and Physical, chart, labs and discussed the procedure including the risks, benefits and alternatives for the proposed anesthesia with the patient or authorized representative who has indicated his/her understanding and acceptance.   Dental Advisory Given  Plan Discussed with: CRNA and Surgeon  Anesthesia Plan Comments:         Anesthesia Quick Evaluation

## 2011-08-04 NOTE — Anesthesia Postprocedure Evaluation (Signed)
Anesthesia Post Note  Patient: Ashley [name]  Procedure(s) Performed: Procedure(s) (LRB): LAPAROSCOPIC ASSISTED VAGINAL HYSTERECTOMY (N/A) LAPAROSCOPIC BILATERAL SALPINGO OOPHERECTOMY (Bilateral)  Anesthesia type: General  Patient location: PACU  Post pain: Pain level controlled  Post assessment: Post-op Vital signs reviewed  Last Vitals:  Filed Vitals:   08/04/11 1300  BP: 124/62  Pulse: 91  Temp:   Resp: 16    Post vital signs: Reviewed  Level of consciousness: sedated  Complications: No apparent anesthesia complications

## 2011-08-04 NOTE — OR Nursing (Signed)
Pre-op checklist incomplete. H&P contained incorrect MRN and DOB. DR. Ambrose Mantle notified and corrected. Verified and signed.

## 2011-08-04 NOTE — H&P (Signed)
NAMEMELONY, TENPAS NO.:  [id number]  MEDICAL RECORD NO.:  [id number]  LOCATION:                                 FACILITY:  PHYSICIAN:  Malachi Pro. Ambrose Mantle, M.D. DATE OF BIRTH:  [birth year]  DATE OF ADMISSION:  08/04/2011 DATE OF DISCHARGE:                             HISTORY & PHYSICAL   PRESENT ILLNESS:  This is a 46 year old black female, para 2-0-1-2, who is admitted to the hospital for hysterectomy and removal of tubes and ovaries because of persistent abnormal uterine bleeding, known leiomyomata uteri, and pelvic pain.  This patient was seen initially in 2011 and she has complained over the last year and a half of abnormal uterine bleeding, hot flashes, night sweats, and pelvic and abdominal pain.  Her last menstrual period in.  Her last menstrual periods have been irregular as of June 23.  She had a period on May 9 through May 14, May 23 through May 27, June 7 through June 15, and she began bleeding again on June 23.  She is not menopausal but because of her intense symptoms of menopause related symptoms, she was begun on Activella and advised at that time that the Activella would not suppress her menstrual cycles .  Over the last year, she has complained repeatedly of abnormal uterine bleeding, and I have tried to treat her abnormal bleeding with Provera with no success.  She is also concerned that her sister had ovarian cancer and died from it.  The patient underwent an endometrial biopsy within the last 6 months that showed benign proliferative endometrium.  Ultrasound showed no abnormalities of the ovaries except a slightly complex cyst in the left ovary, which on repeat exam had resolved.  The fibroids are intramural, not submucosal.  She is admitted now for attempted laparoscopic-assisted vaginal hysterectomy and bilateral salpingo-oophorectomy at her request.  She will be informed that abdominal hysterectomy may be necessary.  PAST MEDICAL  HISTORY:  Reveals allergy to PENICILLIN.  She has had tubal ligation.  She had gestational diabetes, GERD, hypertension, and migraines.  MEDICATIONS:  Include doxycycline, hyoscyamine, lisinopril, metoprolol, Nexium, Topamax, and Zomig.  FAMILY HISTORY:  Sister died from ovarian cancer.  REPRODUCTIVE HISTORY:  The patient had a spontaneous abortion in 1986, vaginal deliveries in 1996 and 2002.  The second delivery was complicated by high blood pressure and gestational diabetes.  She denies alcohol use.  She denies illicit substance abuse and states she was a former smoker.  PHYSICAL EXAMINATION:  GENERAL:  Revealed a well-developed, well- nourished black female, in no acute distress. VITAL SIGNS:  Blood pressure 140/78, pulse 80. HEAD, EYES, NOSE, AND THROAT:  No cranial abnormalities.  Extraocular movements intact.  Nose and pharynx clear. NECK:  Supple without thyromegaly. BREASTS:  Soft without masses. LUNGS:  Clear to auscultation. HEART:  Normal size and sounds.  No murmurs. ABDOMEN:  Soft and nontender.  No masses are palpable. PELVIC:  The vulva and vagina are clean.  The cervix is clean.  The uterus is anterior, probably 8-9 week size with 5 fibroids being seen on ultrasound, none of which are large.  Uterus is quite irregular.  The adnexa are free of  masses.  ADMITTING IMPRESSION:  Leiomyomata uteri, abnormal uterine bleeding, pelvic pain, family history of ovarian cancer.  The patient is admitted for laparoscopic-assisted vaginal hysterectomy.  She requested her tubes and ovaries be removed.  She also will be informed that the surgery may require an incision for abdominal hysterectomy.  Please also note that I have covered with her potential complications of surgery include but not limited to pulmonary embolus, wound disruption, hemorrhage with need for reoperation and/or transfusion, fistula formation, injury to surrounding structures, intestinal obstruction, and  unpredicted impact on her sex drive.  She understands and agrees to proceed.     Malachi Pro. Ambrose Mantle, M.D.     TFH/MEDQ  D:  08/03/2011  T:  08/03/2011  Job:  [id number]

## 2011-08-04 NOTE — Transfer of Care (Signed)
Immediate Anesthesia Transfer of Care Note  Patient: Ashley [name]  Procedure(s) Performed: Procedure(s) (LRB): LAPAROSCOPIC ASSISTED VAGINAL HYSTERECTOMY (N/A) LAPAROSCOPIC BILATERAL SALPINGO OOPHERECTOMY (Bilateral)  Patient Location: PACU  Anesthesia Type: General  Level of Consciousness: sedated  Airway & Oxygen Therapy: Patient Spontanous Breathing and Patient connected to nasal cannula oxygen  Post-op Assessment: Report given to PACU RN  Post vital signs: Reviewed and stable  Complications: No apparent anesthesia complications

## 2011-08-04 NOTE — Progress Notes (Signed)
Patient ID: Ashley [name], female   DOB: 01/27/1965, 46 y.o.   MRN: [id number] I examined this lady on 08-01-11 and she reports no change in her health since that time

## 2011-08-04 NOTE — Progress Notes (Signed)
Day of Surgery Procedure(s) (LRB): LAPAROSCOPIC ASSISTED VAGINAL HYSTERECTOMY (N/A) LAPAROSCOPIC BILATERAL SALPINGO OOPHERECTOMY (Bilateral)  Subjective: Patient reports incisional pain and tolerating PO.  Pain controlled  Objective: I have reviewed patient's vital signs and intake and output.  General: alert and no distress Resp: clear to auscultation bilaterally Cardio: regular rate and rhythm GI: soft, non-tender; bowel sounds normal; no masses,  no organomegaly Extremities: extremities normal, atraumatic, no cyanosis or edema Inc C/D/I  Assessment: s/p Procedure(s) (LRB): LAPAROSCOPIC ASSISTED VAGINAL HYSTERECTOMY (N/A) LAPAROSCOPIC BILATERAL SALPINGO OOPHERECTOMY (Bilateral): stable  Plan: Encourage ambulation routine care, doing well  LOS: 0 days    BOVARD,Javanni Maring 08/04/2011, 6:51 PM

## 2011-08-05 ENCOUNTER — Encounter (HOSPITAL_COMMUNITY): Payer: Self-pay | Admitting: Obstetrics and Gynecology

## 2011-08-05 LAB — CBC
Hemoglobin: 12 g/dL (ref 12.0–15.0)
MCH: 23.9 pg — ABNORMAL LOW (ref 26.0–34.0)
RBC: 5.02 MIL/uL (ref 3.87–5.11)

## 2011-08-05 MED ORDER — IBUPROFEN 600 MG PO TABS: 600.0000 mg | ORAL_TABLET | Freq: Four times a day (QID) | ORAL | Status: AC | PRN

## 2011-08-05 MED ORDER — OXYCODONE-ACETAMINOPHEN 5-325 MG PO TABS: 1.0000 | ORAL_TABLET | Freq: Four times a day (QID) | ORAL | Status: AC | PRN

## 2011-08-05 NOTE — Progress Notes (Signed)
Patient ID: Ashley [name], female   DOB: 05-15-1965, 46 y.o.   MRN: [id number] #1 afebrile BP normal Tolerating a diet Ambulating HGB stable ready for d/c.

## 2011-08-05 NOTE — Discharge Instructions (Signed)
No vaginal entrance No heavy lifting Call with temp> 100.4 degrees Call with heavy vaginal bleeding or any problems.

## 2011-08-05 NOTE — Op Note (Signed)
[name], [name] NO.:  [id number]  MEDICAL RECORD NO.:  [id number]  LOCATION:  9305                          FACILITY:  [facility]  PHYSICIAN:  Ashley [name], M.D. DATE OF BIRTH:  10-17-1965  DATE OF PROCEDURE:  08/04/2011 DATE OF DISCHARGE:                              OPERATIVE REPORT   PREOPERATIVE DIAGNOSES:  Leiomyomata uteri, abnormal bleeding in spite of medical therapy, dysmenorrhea, pelvic pain, and family history of ovarian cancer.  POSTOPERATIVE DIAGNOSES:  Leiomyomata uteri, abnormal bleeding in spite of medical therapy, dysmenorrhea, pelvic pain, and family history of ovarian cancer.  OPERATION:  Laparoscopic-assisted vaginal hysterectomy and bilateral salpingo-oophorectomy.  OPERATOR:  Ashley [name], M.D.  ASSISTANT:  Ashley [name], M.D.  ANESTHESIA:  General anesthesia.  PROCEDURE IN DETAIL:  The patient was brought to the operating room and placed under satisfactory general anesthesia.  She was placed in the Belle stirrups.  The abdomen, vulva, vagina, and urethra were prepped with Betadine solution and draped as a sterile field.  After a Hulka cannula was placed into the uterus and attached to the anterior cervical lip, the Foley catheter was inserted to straight drain.  The umbilicus was grasped with Allis clamps and an incision was made in the umbilicus, carried down to the fascia.  The fascia was incised and the peritoneum was entered.  A Hasson cannula was placed into the umbilical incision. The balloon was insufflated.  The pursestring suture of 0 Vicryl was tied to the instrument and the visualization of the abdominal cavity was done.  Two accessory ports were placed at the level of the umbilicus close to the lateral aspect of the abdominal wall, and then the pelvis was surveyed.  These instruments were placed under direct vision.  The uterus was elevated.  Both tubes and ovaries appeared normal except the tubes had been  ligated.  The uterus was enlarged slightly with fibroids. There were no significant adhesions.  We began on the left side to take down the left infundibulopelvic ligament.  The left round ligament parametrial tissues, did the same thing on the right side, created a partial bladder flap and then stopped the surgery from above and went below.  The patient was placed in lithotomy position.  The dilute solution of Pitressin with 10 units and 120 mL was injected around the cervicovaginal junction, a total of about 16 mL were used.  A circumferential incision was then made around the cervix.  The bladder was pushed ahead.  The posterior cul-de-sac was entered.  The uterosacral ligaments were clamped, cut, and suture ligated bilaterally. They were held.  The cardinal ligaments were clamped, cut, and suture ligated, and above the cardinal ligaments on the patient's left side, I ran into difficulty with bleeding.  I finally got a controlled and after a couple of additional bites on both sides, was able to remove the uterus, tubes, and ovaries as one specimen.  The posterior vaginal mucosa was sutured to the peritoneum with a running suture of 0 Vicryl. The uterosacral ligaments were sutured together in the midline and were tied together with the sutures that had been held earlier.  The vaginal mucosa was then reapproximated  with interrupted figure-of-eight sutures of 0 Vicryl.  At this point, the vaginal procedure was completed.  I took the patient out of lithotomy and placed her in a more supine position in the [place] stirrups.  Reinsufflated the abdominal cavity with CO2.  Liberal irrigation with the Nezhat__________ cannula was done.  There was no active bleeding present.  Both ureters were identified again and found to be normal.  CO2 was allowed to escape from the peritoneal cavity.  I changed the 0 Vicryl suture to a #1 PDS, tied it down, closed the subcu tissue with 3-0 Vicryl.  The skin with  interrupted 3-0 plain catgut.  On the left side of the abdominal port, I did use a stitch for hemostasis and then closed the right incision with Steri-Strips.  The patient seemed to tolerate the procedure well.  Blood loss about 400 mL. Sponge and needle counts were correct, and she was returned to the recovery in satisfactory condition.     Ashley [name], M.D.     TFH/MEDQ  D:  08/04/2011  T:  08/05/2011  Job:  [id number]

## 2011-08-05 NOTE — Addendum Note (Signed)
Addendum  created 08/05/11 0817 by Algis Greenhouse, CRNA   Modules edited:Notes Section

## 2011-08-05 NOTE — Anesthesia Postprocedure Evaluation (Signed)
  Anesthesia Post-op Note  Patient: Ashley [name]  Procedure(s) Performed: Procedure(s) (LRB): LAPAROSCOPIC ASSISTED VAGINAL HYSTERECTOMY (N/A) LAPAROSCOPIC BILATERAL SALPINGO OOPHERECTOMY (Bilateral)  [facility]: [facility]  Anesthesia Type: General  Level of Consciousness: awake  Airway and Oxygen Therapy: Patient Spontanous Breathing and Patient connected to nasal cannula oxygen  Post-op Pain: mild  Post-op Assessment: Post-op Vital signs reviewed  Post-op Vital Signs: Reviewed and stable  Complications: No apparent anesthesia complications

## 2011-08-05 NOTE — Progress Notes (Signed)
Pt is discharge in the care of son..Downstairs per ambulatory. Denies any pain or discomfort. Stable. All discharge instructions were given to pt with good understanding. Questions were asked answered. All abdominal incisions are clean and dry.Marland Kitchen

## 2011-08-06 NOTE — Discharge Summary (Signed)
Ashley [name], Ashley [name] NO.:  [id number]  MEDICAL RECORD NO.:  [id number]  LOCATION:  9305                          FACILITY:  [facility]  PHYSICIAN:  Malachi Pro. Ambrose Mantle, M.D. DATE OF BIRTH:  December 01, 1965  DATE OF ADMISSION:  08/04/2011 DATE OF DISCHARGE:  08/05/2011                              DISCHARGE SUMMARY   This is a 46 year old black female with leiomyomata uteri, persistent abnormal uterine bleeding in spite of hormone manipulation, pelvic pain, and a family history of a sister died from ovarian cancer, admitted for laparoscopic-assisted vaginal hysterectomy and bilateral salpingo- oophorectomy.  The patient underwent the procedure on August 04, 2011, under general anesthesia with Dr. Ambrose Mantle and Dr. Jackelyn Knife assisting. The patient did well postoperatively.  Her pain was controlled.  She states her pain is at a level of 4 today.  She is ambulated.  She has tolerated a diet, and her abdomen is soft, and nontender, and she is ready for discharge.  LABORATORY DATA:  Showed initial comprehensive metabolic profile that was normal.  Initial hemoglobin 12.5, hematocrit 38.7, white count 6500, platelet count 307,000, 48 segs, 45 lymphs, 6 monos, 1 eosinophil, 1 basophil.  Urinalysis was negative.  MSRA, PCR was negative.  Staph aureus negative and urine pregnancy test negative.  Followup urinalysis was normal.  Hemoglobin on the day after surgery was 12.0, hematocrit 36.9, white count 15,400, platelet count 306,000.  Pathology report is pending.  FINAL DIAGNOSES:  Leiomyomata uteri, abnormal uterine bleeding, pelvic pain, dysmenorrhea, family history of ovarian cancer.  OPERATION:  Laparoscopic-assisted vaginal hysterectomy and bilateral salpingo-oophorectomy.  FINAL CONDITION:  Improved.  INSTRUCTIONS:  Include our regular discharge instructions.  She is to resume all her medications that she was on prior to coming to the hospital except aspirin.  She is to take  Motrin 600 mg 30 tablets 1 every 6 hours as needed for pain with 1 refill and Percocet 5/325 thirty tablets 1 every 6 hours as needed for pain.  She is advised no vaginal entrance, no heavy lifting or strenuous activity.  Call with fever above 100.4 degrees.  Call with any unusual problems.  She is to return to the office in 2 weeks for followup examination and she has requested that her path report will be discussed with her either before or at the time of her 2-week checkup.     Malachi Pro. Ambrose Mantle, M.D.     TFH/MEDQ  D:  08/05/2011  T:  08/06/2011  Job:  [id number]

## 2011-10-15 ENCOUNTER — Other Ambulatory Visit: Payer: Self-pay | Admitting: Obstetrics and Gynecology

## 2011-10-15 DIAGNOSIS — Z1231 Encounter for screening mammogram for malignant neoplasm of breast: Secondary | ICD-10-CM

## 2011-10-31 ENCOUNTER — Ambulatory Visit (INDEPENDENT_AMBULATORY_CARE_PROVIDER_SITE_OTHER): Payer: Medicare Other | Admitting: General Surgery

## 2011-10-31 ENCOUNTER — Encounter (INDEPENDENT_AMBULATORY_CARE_PROVIDER_SITE_OTHER): Payer: Self-pay | Admitting: General Surgery

## 2011-10-31 VITALS — BP 136/82 | HR 72 | Temp 98.2°F | Resp 16 | Ht 67.0 in | Wt 166.8 lb

## 2011-10-31 DIAGNOSIS — D171 Benign lipomatous neoplasm of skin and subcutaneous tissue of trunk: Secondary | ICD-10-CM

## 2011-10-31 DIAGNOSIS — D1739 Benign lipomatous neoplasm of skin and subcutaneous tissue of other sites: Secondary | ICD-10-CM

## 2011-10-31 NOTE — Progress Notes (Signed)
Patient ID: Ashley [name], female   DOB: [birth year], 46 y.o.   MRN: [id number]  Chief Complaint  Patient presents with  . Lipoma    HPI Ashley [name] is a 46 y.o. female.  This patient was referred by Dr. Nicholos Johns for evaluation of an abdominal wall mass. She says that she has noticed this for several years even dating back to 2002. She says that it has been increasing in size since then. She says it does not cause any particular discomfort or pain but she is concerned due to the growth. Has not noticed any drainage or redness in the area of fever chills.  no family history of malignancies. HPI  Past Medical History  Diagnosis Date  . SVD (spontaneous vaginal delivery)     x 2  . Anemia   . Eczema   . GERD (gastroesophageal reflux disease)   . Headache   . Hypertension   . Heart murmur     dx as a child, no probems as adult    Past Surgical History  Procedure Date  . Tubal ligation 2002  . Wisdom tooth extraction   . Laparoscopic assisted vaginal hysterectomy 08/04/2011    Procedure: LAPAROSCOPIC ASSISTED VAGINAL HYSTERECTOMY;  Surgeon: Bing Plume, MD;  Location: [facility];  Service: Gynecology;  Laterality: N/A;  . Abdominal hysterectomy     Family History  Problem Relation Age of Onset  . Cancer Maternal Aunt     Ovarian    Social History History  Substance Use Topics  . Smoking status: Former Smoker -- 0.2 packs/day for 4 years    Types: Cigarettes    Quit date: 01/20/1994  . Smokeless tobacco: Never Used  . Alcohol Use: Yes     socially - wine    Allergies  Allergen Reactions  . Penicillins Hives    Current Outpatient Prescriptions  Medication Sig Dispense Refill  . amitriptyline (ELAVIL) 25 MG tablet       . ammonium lactate (AMLACTIN) 12 % cream Apply 1 Bottle topically daily as needed. For dryness from eczema      . CLARAVIS 40 MG capsule       . clindamycin (CLINDAGEL) 1 % gel Apply 1 application topically 2 (two) times daily.      Marland Kitchen esomeprazole  (NEXIUM) 40 MG capsule Take 40 mg by mouth daily before breakfast.      . Ferrous Sulfate Dried (SLOW RELEASE IRON) 45 MG TBCR Take 1 tablet by mouth daily.      Marland Kitchen ibuprofen (ADVIL,MOTRIN) 600 MG tablet       . imipramine (TOFRANIL) 25 MG tablet Take 75 mg by mouth at bedtime.      Marland Kitchen lisinopril (PRINIVIL,ZESTRIL) 10 MG tablet Take 10 mg by mouth daily.      . metoprolol succinate (TOPROL-XL) 25 MG 24 hr tablet Take 25 mg by mouth daily.      . naproxen sodium (ANAPROX) 550 MG tablet       . zolmitriptan (ZOMIG) 5 MG tablet Take 5 mg by mouth daily as needed. For migraines      . fluconazole (DIFLUCAN) 150 MG tablet Take 150 mg by mouth once a week.      . minocycline (MINOCIN,DYNACIN) 100 MG capsule Take 100 mg by mouth 2 (two) times daily.      Marland Kitchen oxyCODONE-acetaminophen (PERCOCET/ROXICET) 5-325 MG per tablet         Review of Systems Review of Systems All other review of systems negative  or noncontributory except as stated in the HPI  Blood pressure 136/82, pulse 72, temperature 98.2 F (36.8 C), temperature source Temporal, resp. rate 16, height 5\' 7"  (1.702 m), weight 166 lb 12.8 oz (75.66 kg), last menstrual period 07/13/2011.  Physical Exam Physical Exam Physical Exam  Nursing note and vitals reviewed. Constitutional: She is oriented to person, place, and time. She appears well-developed and well-nourished. No distress.  HENT:  Head: Normocephalic and atraumatic.  Mouth/Throat: No oropharyngeal exudate.  Eyes: Conjunctivae and EOM are normal. Pupils are equal, round, and reactive to light. Right eye exhibits no discharge. Left eye exhibits no discharge. No scleral icterus.  Neck: Normal range of motion. Neck supple. No tracheal deviation present.  Cardiovascular: Normal rate, regular rhythm, normal heart sounds and intact distal pulses.   Pulmonary/Chest: Effort normal and breath sounds normal. No stridor. No respiratory distress. She has no wheezes.  Abdominal: Soft. Bowel  sounds are normal. She exhibits no distension and no mass. There is no tenderness. There is no rebound and no guarding.  Musculoskeletal: Normal range of motion. She exhibits no edema and no tenderness.  Neurological: She is alert and oriented to person, place, and time.  Skin: Skin is warm and dry. No rash noted. She is not diaphoretic. No erythema. No pallor. She has a palpable 3-4cm  Mass in lower midline.  No change with valsalva. She also has a <2cm visible bulge in middle of right posterior thigh but I do not appreciate a mass under the area. I did perform bedside ultrasound with high-frequency linear probe in this was not a clear hernia. Did not see any break in the fashion or any change with Valsalva. For this reason, I think this is most likely a soft tissue lipoma Psychiatric: She has a normal mood and affect. Her behavior is normal. Judgment and thought content normal.    Data Reviewed   Assessment    Abdominal wall and right leg mass I think that the abdominal wall mass is a lipoma since this is in the midline this certainly could be a ventral hernia as well. We discussed the possibility of both options of lipoma excision and ventral hernia repair and the risks and benefits of each.  We discussed the risks of infection, bleeding, pain, scarring, recurrence, need for future surgery, poor cosmesis. We also discussed the option of exploration of the area of the right thigh and I think that she would like  proceed with this as well. I explained that since I do not feel much in this area there would be a possibility of not finding anything in this area and persistence of her bulge and possibly a scar for no potential benefit.    Plan    We will set her up for excision of abdominal wall mass and right thigh mass and possible ventral hernia repair at her convenience       Tryce Surratt DAVID 10/31/2011, 1:50 PM

## 2011-12-01 ENCOUNTER — Ambulatory Visit
Admission: RE | Admit: 2011-12-01 | Discharge: 2011-12-01 | Disposition: A | Discharge status: Home / Self Care | Payer: Medicare Other | Source: Ambulatory Visit | Attending: Obstetrics and Gynecology | Admitting: Obstetrics and Gynecology

## 2011-12-01 DIAGNOSIS — Z1231 Encounter for screening mammogram for malignant neoplasm of breast: Secondary | ICD-10-CM

## 2012-01-08 ENCOUNTER — Encounter (HOSPITAL_BASED_OUTPATIENT_CLINIC_OR_DEPARTMENT_OTHER): Payer: Self-pay | Admitting: *Deleted

## 2012-01-08 NOTE — Progress Notes (Signed)
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

## 2012-01-09 ENCOUNTER — Encounter (HOSPITAL_BASED_OUTPATIENT_CLINIC_OR_DEPARTMENT_OTHER)
Admission: RE | Admit: 2012-01-09 | Discharge: 2012-01-09 | Disposition: A | Discharge status: Home / Self Care | Payer: Medicare Other | Source: Ambulatory Visit | Attending: General Surgery | Admitting: General Surgery

## 2012-01-09 LAB — BASIC METABOLIC PANEL
CO2: 29 mEq/L (ref 19–32)
Chloride: 105 mEq/L (ref 96–112)
Glucose, Bld: 94 mg/dL (ref 70–99)
Potassium: 4 mEq/L (ref 3.5–5.1)
Sodium: 142 mEq/L (ref 135–145)

## 2012-01-15 ENCOUNTER — Ambulatory Visit (HOSPITAL_BASED_OUTPATIENT_CLINIC_OR_DEPARTMENT_OTHER): Payer: Medicare Other | Admitting: Certified Registered"

## 2012-01-15 ENCOUNTER — Encounter (HOSPITAL_BASED_OUTPATIENT_CLINIC_OR_DEPARTMENT_OTHER): Payer: Self-pay

## 2012-01-15 ENCOUNTER — Encounter (HOSPITAL_BASED_OUTPATIENT_CLINIC_OR_DEPARTMENT_OTHER): Payer: Self-pay | Admitting: Certified Registered"

## 2012-01-15 ENCOUNTER — Ambulatory Visit (HOSPITAL_BASED_OUTPATIENT_CLINIC_OR_DEPARTMENT_OTHER)
Admission: RE | Admit: 2012-01-15 | Discharge: 2012-01-15 | Disposition: A | Discharge status: Home / Self Care | Payer: Medicare Other | Source: Ambulatory Visit | Attending: General Surgery | Admitting: General Surgery

## 2012-01-15 ENCOUNTER — Encounter (HOSPITAL_BASED_OUTPATIENT_CLINIC_OR_DEPARTMENT_OTHER)
Admission: RE | Disposition: A | Discharge status: Home / Self Care | Payer: Self-pay | Source: Ambulatory Visit | Attending: General Surgery

## 2012-01-15 DIAGNOSIS — K219 Gastro-esophageal reflux disease without esophagitis: Secondary | ICD-10-CM | POA: Insufficient documentation

## 2012-01-15 DIAGNOSIS — R011 Cardiac murmur, unspecified: Secondary | ICD-10-CM | POA: Insufficient documentation

## 2012-01-15 DIAGNOSIS — D1739 Benign lipomatous neoplasm of skin and subcutaneous tissue of other sites: Secondary | ICD-10-CM | POA: Insufficient documentation

## 2012-01-15 DIAGNOSIS — Z87891 Personal history of nicotine dependence: Secondary | ICD-10-CM | POA: Insufficient documentation

## 2012-01-15 DIAGNOSIS — Z90710 Acquired absence of both cervix and uterus: Secondary | ICD-10-CM | POA: Insufficient documentation

## 2012-01-15 DIAGNOSIS — Z8041 Family history of malignant neoplasm of ovary: Secondary | ICD-10-CM | POA: Insufficient documentation

## 2012-01-15 DIAGNOSIS — D179 Benign lipomatous neoplasm, unspecified: Secondary | ICD-10-CM

## 2012-01-15 DIAGNOSIS — I10 Essential (primary) hypertension: Secondary | ICD-10-CM | POA: Insufficient documentation

## 2012-01-15 HISTORY — PX: MASS EXCISION: SHX2000

## 2012-01-15 HISTORY — PX: VENTRAL HERNIA REPAIR: SHX424

## 2012-01-15 HISTORY — DX: Benign neoplasm of brain, unspecified: D33.2

## 2012-01-15 LAB — POCT HEMOGLOBIN-HEMACUE: Hemoglobin: 13.6 g/dL (ref 12.0–15.0)

## 2012-01-15 SURGERY — EXCISION MASS
Anesthesia: General | Site: Leg Upper | Laterality: Right | Wound class: Clean

## 2012-01-15 MED ORDER — LIDOCAINE HCL (CARDIAC) 20 MG/ML IV SOLN
INTRAVENOUS | Status: DC | PRN
  Administered 2012-01-15: 60 mg via INTRAVENOUS

## 2012-01-15 MED ORDER — OXYCODONE HCL 5 MG PO TABS: 5.0000 mg | ORAL_TABLET | Freq: Once | ORAL | Status: DC | PRN

## 2012-01-15 MED ORDER — PROPOFOL 10 MG/ML IV BOLUS
INTRAVENOUS | Status: DC | PRN
  Administered 2012-01-15: 140 mg via INTRAVENOUS

## 2012-01-15 MED ORDER — HYDROCODONE-ACETAMINOPHEN 5-325 MG PO TABS: 1.0000 | ORAL_TABLET | ORAL | Status: DC | PRN

## 2012-01-15 MED ORDER — LIDOCAINE-EPINEPHRINE (PF) 1 %-1:200000 IJ SOLN
INTRAMUSCULAR | Status: DC | PRN
  Administered 2012-01-15: 09:00:00

## 2012-01-15 MED ORDER — MIDAZOLAM HCL 5 MG/5ML IJ SOLN
INTRAMUSCULAR | Status: DC | PRN
  Administered 2012-01-15: 2 mg via INTRAVENOUS

## 2012-01-15 MED ORDER — LACTATED RINGERS IV SOLN
INTRAVENOUS | Status: DC
  Administered 2012-01-15: 10 mL/h via INTRAVENOUS
  Administered 2012-01-15: 07:00:00 via INTRAVENOUS

## 2012-01-15 MED ORDER — ONDANSETRON HCL 4 MG/2ML IJ SOLN
INTRAMUSCULAR | Status: DC | PRN
  Administered 2012-01-15: 4 mg via INTRAVENOUS

## 2012-01-15 MED ORDER — HYDROMORPHONE HCL PF 1 MG/ML IJ SOLN
0.2500 mg | INTRAMUSCULAR | Status: DC | PRN
  Administered 2012-01-15: 0.5 mg via INTRAVENOUS

## 2012-01-15 MED ORDER — DEXAMETHASONE SODIUM PHOSPHATE 4 MG/ML IJ SOLN
INTRAMUSCULAR | Status: DC | PRN
  Administered 2012-01-15: 8 mg via INTRAVENOUS

## 2012-01-15 MED ORDER — OXYCODONE HCL 5 MG/5ML PO SOLN: 5.0000 mg | Freq: Once | ORAL | Status: DC | PRN

## 2012-01-15 MED ORDER — SUCCINYLCHOLINE CHLORIDE 20 MG/ML IJ SOLN
INTRAMUSCULAR | Status: DC | PRN
  Administered 2012-01-15: 120 mg via INTRAVENOUS

## 2012-01-15 MED ORDER — FENTANYL CITRATE 0.05 MG/ML IJ SOLN
INTRAMUSCULAR | Status: DC | PRN
Start: 2012-01-15 — End: 2012-01-15
  Administered 2012-01-15: 100 ug via INTRAVENOUS

## 2012-01-15 SURGICAL SUPPLY — 41 items
ADH SKN CLS APL DERMABOND .7 (GAUZE/BANDAGES/DRESSINGS) ×4
BLADE SURG 15 STRL LF DISP TIS (BLADE) ×2 IMPLANT
BLADE SURG 15 STRL SS (BLADE) ×3
CANISTER SUCTION 1200CC (MISCELLANEOUS) ×3 IMPLANT
CHLORAPREP W/TINT 26ML (MISCELLANEOUS) ×4 IMPLANT
COVER MAYO STAND STRL (DRAPES) ×3 IMPLANT
COVER TABLE BACK 60X90 (DRAPES) ×3 IMPLANT
DECANTER SPIKE VIAL GLASS SM (MISCELLANEOUS) IMPLANT
DERMABOND ADVANCED (GAUZE/BANDAGES/DRESSINGS) ×2
DERMABOND ADVANCED .7 DNX12 (GAUZE/BANDAGES/DRESSINGS) IMPLANT
DRAPE PED LAPAROTOMY (DRAPES) ×4 IMPLANT
DRAPE UTILITY XL STRL (DRAPES) ×5 IMPLANT
ELECT COATED BLADE 2.86 ST (ELECTRODE) ×3 IMPLANT
ELECT REM PT RETURN 9FT ADLT (ELECTROSURGICAL) ×3
ELECTRODE REM PT RTRN 9FT ADLT (ELECTROSURGICAL) ×2 IMPLANT
GAUZE SPONGE 4X4 12PLY STRL LF (GAUZE/BANDAGES/DRESSINGS) ×1 IMPLANT
GLOVE BIO SURGEON STRL SZ7 (GLOVE) ×1 IMPLANT
GLOVE SURG SS PI 7.5 STRL IVOR (GLOVE) ×7 IMPLANT
GOWN PREVENTION PLUS XLARGE (GOWN DISPOSABLE) ×2 IMPLANT
GOWN PREVENTION PLUS XXLARGE (GOWN DISPOSABLE) ×3 IMPLANT
NEEDLE HYPO 22GX1.5 SAFETY (NEEDLE) ×3 IMPLANT
NS IRRIG 1000ML POUR BTL (IV SOLUTION) ×3 IMPLANT
PACK BASIN DAY SURGERY FS (CUSTOM PROCEDURE TRAY) ×3 IMPLANT
PENCIL BUTTON HOLSTER BLD 10FT (ELECTRODE) ×3 IMPLANT
SLEEVE SCD COMPRESS KNEE MED (MISCELLANEOUS) ×1 IMPLANT
SPONGE LAP 4X18 X RAY DECT (DISPOSABLE) ×3 IMPLANT
SUCTION FRAZIER TIP 10 FR DISP (SUCTIONS) IMPLANT
SUT ETHILON 2 0 FS 18 (SUTURE) IMPLANT
SUT MNCRL AB 4-0 PS2 18 (SUTURE) ×2 IMPLANT
SUT SILK 2 0 SH (SUTURE) IMPLANT
SUT VIC AB 0 SH 27 (SUTURE) IMPLANT
SUT VIC AB 3-0 SH 27 (SUTURE) ×6
SUT VIC AB 3-0 SH 27X BRD (SUTURE) IMPLANT
SUT VICRYL 3-0 CR8 SH (SUTURE) IMPLANT
SYR BULB 3OZ (MISCELLANEOUS) ×3 IMPLANT
SYR CONTROL 10ML LL (SYRINGE) ×3 IMPLANT
TOWEL OR 17X24 6PK STRL BLUE (TOWEL DISPOSABLE) ×3 IMPLANT
TOWEL OR NON WOVEN STRL DISP B (DISPOSABLE) ×3 IMPLANT
TUBE CONNECTING 20X1/4 (TUBING) ×3 IMPLANT
WATER STERILE IRR 1000ML POUR (IV SOLUTION) IMPLANT
YANKAUER SUCT BULB TIP NO VENT (SUCTIONS) ×3 IMPLANT

## 2012-01-15 NOTE — Brief Op Note (Signed)
01/15/2012  8:46 AM  PATIENT:  Ashley [name]  46 y.o. female  PRE-OPERATIVE DIAGNOSIS:  abdominal wall mass & Right posterior leg mass  POST-OPERATIVE DIAGNOSIS:  abdominal wall mass & Right posterior leg mass  PROCEDURE:  Procedure(s) (LRB) with comments: EXCISION MASS (Right) - Excision of lower abdominal wall mass & excision  Right posterior upper leg mass HERNIA REPAIR VENTRAL ADULT (N/A) - Excision lower abdomen soft tissue mass (no hernia)  SURGEON:  Surgeon(s) and Role:    * Lodema Pilot, DO - Primary  PHYSICIAN ASSISTANT:   ASSISTANTS: none   ANESTHESIA:   general  EBL:  Total I/O In: 900 [I.V.:900] Out: -   BLOOD ADMINISTERED:none  DRAINS: none   LOCAL MEDICATIONS USED:  MARCAINE    and LIDOCAINE   SPECIMEN:  Source of Specimen:  abdominal wall mass, and right posterior thigh mass  DISPOSITION OF SPECIMEN:  PATHOLOGY  COUNTS:  YES  TOURNIQUET:  * No tourniquets in log *  DICTATION: .Other Dictation: Dictation Number [id number]  PLAN OF CARE: Discharge to home after PACU  PATIENT DISPOSITION:  PACU - hemodynamically stable.   Delay start of Pharmacological VTE agent (>24hrs) due to surgical blood loss or risk of bleeding: no

## 2012-01-15 NOTE — Anesthesia Preprocedure Evaluation (Signed)
Anesthesia Evaluation  Patient identified by MRN, date of birth, ID band Patient awake    Reviewed: Allergy & Precautions, H&P , NPO status , Patient's Chart, lab work & pertinent test results, reviewed documented beta blocker date and time   Airway Mallampati: I TM Distance: >3 FB Neck ROM: Full    Dental No notable dental hx. (+) Teeth Intact and Dental Advisory Given   Pulmonary neg pulmonary ROS,  breath sounds clear to auscultation  Pulmonary exam normal       Cardiovascular hypertension, On Medications and On Home Beta Blockers Rhythm:Regular Rate:Normal     Neuro/Psych negative neurological ROS  negative psych ROS   GI/Hepatic Neg liver ROS, GERD-  Medicated and Controlled,  Endo/Other  negative endocrine ROS  Renal/GU negative Renal ROS  negative genitourinary   Musculoskeletal   Abdominal   Peds  Hematology negative hematology ROS (+)   Anesthesia Other Findings   Reproductive/Obstetrics negative OB ROS                           Anesthesia Physical Anesthesia Plan  ASA: II  Anesthesia Plan: General   Post-op Pain Management:    Induction: Intravenous  Airway Management Planned: Oral ETT  Additional Equipment:   Intra-op Plan:   Post-operative Plan: Extubation in OR  Informed Consent: I have reviewed the patients History and Physical, chart, labs and discussed the procedure including the risks, benefits and alternatives for the proposed anesthesia with the patient or authorized representative who has indicated his/her understanding and acceptance.   Dental advisory given  Plan Discussed with: CRNA  Anesthesia Plan Comments:         Anesthesia Quick Evaluation

## 2012-01-15 NOTE — Anesthesia Postprocedure Evaluation (Signed)
  Anesthesia Post-op Note  Patient: Ashley [name]  Procedure(s) Performed: Procedure(s) (LRB) with comments: EXCISION MASS (Right) - Excision of lower abdominal wall mass & excision  Right posterior upper leg mass HERNIA REPAIR VENTRAL ADULT (N/A) - Excision lower abdomen soft tissue mass (no hernia)  Patient Location: PACU  Anesthesia Type:General  Level of Consciousness: awake, alert  and oriented  Airway and Oxygen Therapy: Patient Spontanous Breathing and Patient connected to face mask oxygen  Post-op Pain: mild  Post-op Assessment: Post-op Vital signs reviewed, Patient's Cardiovascular Status Stable, Respiratory Function Stable, Patent Airway and No signs of Nausea or vomiting  Post-op Vital Signs: Reviewed and stable  Complications: No apparent anesthesia complications

## 2012-01-15 NOTE — Transfer of Care (Signed)
Immediate Anesthesia Transfer of Care Note  Patient: Ashley [name]  Procedure(s) Performed: Procedure(s) (LRB) with comments: EXCISION MASS (Right) - Excision of lower abdominal wall mass & excision  Right posterior upper leg mass HERNIA REPAIR VENTRAL ADULT (N/A) - Excision lower abdomen soft tissue mass (no hernia)  Patient Location: PACU  Anesthesia Type:General  Level of Consciousness: sedated and patient cooperative  Airway & Oxygen Therapy: Patient Spontanous Breathing and Patient connected to face mask oxygen  Post-op Assessment: Report given to PACU RN and Post -op Vital signs reviewed and stable  Post vital signs: Reviewed and stable  Complications: No apparent anesthesia complications

## 2012-01-15 NOTE — Anesthesia Procedure Notes (Signed)
Procedure Name: Intubation Date/Time: 01/15/2012 7:38 AM Performed by: Verlan Friends Pre-anesthesia Checklist: Patient identified, Emergency Drugs available, Suction available, Patient being monitored and Timeout performed Patient Re-evaluated:Patient Re-evaluated prior to inductionOxygen Delivery Method: Circle System Utilized Preoxygenation: Pre-oxygenation with 100% oxygen Intubation Type: IV induction Ventilation: Mask ventilation without difficulty Laryngoscope Size: Miller and 3 Grade View: Grade I Tube type: Oral Tube size: 7.0 mm Number of attempts: 1 Airway Equipment and Method: stylet and oral airway Placement Confirmation: ETT inserted through vocal cords under direct vision,  positive ETCO2 and breath sounds checked- equal and bilateral Secured at: 22 cm Tube secured with: Tape Dental Injury: Teeth and Oropharynx as per pre-operative assessment

## 2012-01-15 NOTE — H&P (Signed)
This patient was referred by Dr. Nicholos Johns for evaluation of an abdominal wall mass. She says that she has noticed this for several years even dating back to 2002. She says that it has been increasing in size since then. She says it does not cause any particular discomfort or pain but she is concerned due to the growth. Has not noticed any drainage or redness in the area of fever chills. no family history of malignancies. She denies any changes from our prior visit. HPI  Past Medical History   Diagnosis  Date   .  SVD (spontaneous vaginal delivery)      x 2   .  Anemia    .  Eczema    .  GERD (gastroesophageal reflux disease)    .  Headache    .  Hypertension    .  Heart murmur      dx as a child, no probems as adult    Past Surgical History   Procedure  Date   .  Tubal ligation  2002   .  Wisdom tooth extraction    .  Laparoscopic assisted vaginal hysterectomy  08/04/2011     Procedure: LAPAROSCOPIC ASSISTED VAGINAL HYSTERECTOMY; Surgeon: Bing Plume, MD; Location: [facility]; Service: Gynecology; Laterality: N/A;   .  Abdominal hysterectomy     Family History   Problem  Relation  Age of Onset   .  Cancer  Maternal Aunt       Ovarian    Social History  History   Substance Use Topics   .  Smoking status:  Former Smoker -- 0.2 packs/day for 4 years     Types:  Cigarettes     Quit date:  01/20/1994   .  Smokeless tobacco:  Never Used   .  Alcohol Use:  Yes      socially - wine    Allergies   Allergen  Reactions   .  Penicillins  Hives    Current Outpatient Prescriptions   Medication  Sig  Dispense  Refill   .  amitriptyline (ELAVIL) 25 MG tablet      .  ammonium lactate (AMLACTIN) 12 % cream  Apply 1 Bottle topically daily as needed. For dryness from eczema     .  CLARAVIS 40 MG capsule      .  clindamycin (CLINDAGEL) 1 % gel  Apply 1 application topically 2 (two) times daily.     Marland Kitchen  esomeprazole (NEXIUM) 40 MG capsule  Take 40 mg by mouth daily before breakfast.     .   Ferrous Sulfate Dried (SLOW RELEASE IRON) 45 MG TBCR  Take 1 tablet by mouth daily.     Marland Kitchen  ibuprofen (ADVIL,MOTRIN) 600 MG tablet      .  imipramine (TOFRANIL) 25 MG tablet  Take 75 mg by mouth at bedtime.     Marland Kitchen  lisinopril (PRINIVIL,ZESTRIL) 10 MG tablet  Take 10 mg by mouth daily.     .  metoprolol succinate (TOPROL-XL) 25 MG 24 hr tablet  Take 25 mg by mouth daily.     .  naproxen sodium (ANAPROX) 550 MG tablet      .  zolmitriptan (ZOMIG) 5 MG tablet  Take 5 mg by mouth daily as needed. For migraines     .  fluconazole (DIFLUCAN) 150 MG tablet  Take 150 mg by mouth once a week.     .  minocycline (MINOCIN,DYNACIN) 100 MG capsule  Take  100 mg by mouth 2 (two) times daily.     Marland Kitchen  oxyCODONE-acetaminophen (PERCOCET/ROXICET) 5-325 MG per tablet       Review of Systems  Review of Systems  All other review of systems negative or noncontributory except as stated in the HPI  Wt Readings from Last 3 Encounters:  01/15/12 166 lb (75.297 kg)  01/15/12 166 lb (75.297 kg)  10/31/11 166 lb 12.8 oz (75.66 kg)   Temp Readings from Last 3 Encounters:  01/15/12 98.5 F (36.9 C) Oral  01/15/12 98.5 F (36.9 C) Oral  10/31/11 98.2 F (36.8 C) Temporal   BP Readings from Last 3 Encounters:  01/15/12 147/87  01/15/12 147/87  10/31/11 136/82   Pulse Readings from Last 3 Encounters:  01/15/12 62  01/15/12 62  10/31/11 72     Physical Exam  Physical Exam  Physical Exam  Nursing note and vitals reviewed.  Constitutional: She is oriented to person, place, and time. She appears well-developed and well-nourished. No distress.  HENT:  Head: Normocephalic and atraumatic.  Mouth/Throat: No oropharyngeal exudate.  Eyes: Conjunctivae and EOM are normal. Pupils are equal, round, and reactive to light. Right eye exhibits no discharge. Left eye exhibits no discharge. No scleral icterus.  Neck: Normal range of motion. Neck supple. No tracheal deviation present.  Cardiovascular: Normal rate, regular  rhythm, normal heart sounds and intact distal pulses.  Pulmonary/Chest: Effort normal and breath sounds normal. No stridor. No respiratory distress. She has no wheezes.  Abdominal: Soft. Bowel sounds are normal. She exhibits no distension and no mass. There is no tenderness. There is no rebound and no guarding.  Musculoskeletal: Normal range of motion. She exhibits no edema and no tenderness.  Neurological: She is alert and oriented to person, place, and time.  Skin: Skin is warm and dry. No rash noted. She is not diaphoretic. No erythema. No pallor. She has a palpable 3-4cm Mass in lower midline. No change with valsalva. She also has a <2cm visible bulge in middle of right posterior thigh but I do not appreciate a mass under the area. I did perform bedside ultrasound with high-frequency linear probe in this was not a clear hernia. Did not see any break in the fashion or any change with Valsalva. For this reason, I think this is most likely a soft tissue lipoma  Psychiatric: She has a normal mood and affect. Her behavior is normal. Judgment and thought content normal.  Data Reviewed  Assessment   Abdominal wall and right leg mass  I think that the abdominal wall mass is a lipoma since this is in the midline this certainly could be a ventral hernia as well. We discussed the possibility of both options of lipoma excision and ventral hernia repair and the risks and benefits of each. We discussed the risks of infection, bleeding, pain, scarring, recurrence, need for future surgery, poor cosmesis. If this is a hernia then the possibilities of recurrence and bowel injury and the need for mesh.  We also discussed the option of exploration of the area of the right thigh and I think that she would like proceed with this as well. I explained that since I do not feel much in this area there would be a possibility of not finding anything in this area and persistence of her bulge and possibly a scar for no potential  benefit. She expressed understanding and the sites were marked.

## 2012-01-15 NOTE — Op Note (Signed)
Ashley [name], Ashley NO.:  [id number]  MEDICAL RECORD NO.:  [id number]  LOCATION:                                 FACILITY:  PHYSICIAN:  Lodema Pilot, MD       DATE OF BIRTH:  1966-01-13  DATE OF PROCEDURE:  08/15/2011 DATE OF DISCHARGE:                              OPERATIVE REPORT   PROCEDURE:  Excision of abdominal wall mass and right posterior leg mass.  PREOPERATIVE DIAGNOSIS:  Lipoma.  POSTOPERATIVE DIAGNOSIS:  Lipoma.  SURGEON:  Lodema Pilot, MD  ASSISTANT:  None.  ANESTHESIA:  General anesthesia with 40 mL of 1% lidocaine with epinephrine and 0.25% Marcaine in a 50:50 mixture.  FLUIDS:  900 mL of crystalloid.  ESTIMATED BLOOD LOSS:  Minimal.  DRAINS:  None.  SPECIMENS: 1. Abdominal wall mass measuring about 5 cm x 4 cm. 2. Right posterior thigh mass measuring about 6 cm x 3 cm.  COMPLICATIONS:  None apparent.  FINDINGS:  Ill-defined soft tissue masses consistent with lipoma.  No evidence of any ventral hernia  INDICATION FOR PROCEDURE:  Ashley [name] is a 46 year old female with lower abdominal wall mass and right posterior thigh mass, which are minimally symptomatic.  OPERATIVE DETAILS:  [name] was seen and evaluated in the preoperative area and risks and benefits of procedure were again discussed in lay terms.  Informed consent was obtained.  Surgical sites were marked prior to anesthetic administration and the patient was then taken to the operating room, placed on table in supine position and general endotracheal tube anesthesia was obtained.  Her abdomen was prepped and draped in a standard surgical fashion.  Procedure time-out was performed with all operative team members to confirm proper patient and procedure.  A lower midline incision was made over the palpable mass and dissection carried down to the subcutaneous tissue using Bovie electrocautery.  She had a lipomatous mass, which was dissected circumferentially and  bluntly removed.  This was lobulated and ill- defined, so I explored the subcutaneous tissue and removed any other palpable fatty tissues.  I examined the fascia, I did not see any fascial defects, and I could not palpate any defects either.  This did not appear to be any ventral hernia.  After the abnormal-appearing fatty tissue was removed, I irrigated the wound with sterile saline solution and hemostasis was obtained with Bovie electrocautery.  Then, the wound was injected with 20 mL of 1% lidocaine with epinephrine and 0.25% Marcaine in 50:50 mixture.  The dermis was approximated with interrupted 3-0 Vicryl sutures and the skin was approximated with 4-0 Monocryl subcuticular suture.  Skin was washed and dried and Dermabond was applied.  The patient was then flipped prone, and similar procedure performed on the right posterior thigh.  Longitudinal incision was used and again she had ill-defined fatty tissues, but this was on the left consistent with lipoma, again palpated the area for any abnormal fatty tissue and I excised, again that felt abnormal.  The 2nd lipomatous- appearing mass was removed measuring about 6 cm x 3 cm.  The wound was inspected for hemostasis, which was obtained with Bovie electrocautery and the wound was irrigated with sterile  saline solution and injected with 20 mL of 1% lidocaine with epinephrine and 0.25% Marcaine in a 50:50 mixture.  Dermis was approximated with interrupted 3-0 Vicryl sutures and the skin edges were approximated with 4-0 Monocryl subcuticular suture.  Skin was washed and dried and Dermabond was applied.  All sponge, needle, and instrument counts were correct at the end of the case.  The patient tolerated procedure well without apparent complications.          ______________________________ Lodema Pilot, MD     BL/MEDQ  D:  01/15/2012  T:  01/15/2012  Job:  [id number]

## 2012-01-16 ENCOUNTER — Encounter (HOSPITAL_BASED_OUTPATIENT_CLINIC_OR_DEPARTMENT_OTHER): Payer: Self-pay | Admitting: General Surgery

## 2012-01-26 ENCOUNTER — Telehealth (INDEPENDENT_AMBULATORY_CARE_PROVIDER_SITE_OTHER): Payer: Self-pay | Admitting: General Surgery

## 2012-01-26 NOTE — Telephone Encounter (Signed)
Pt called to ask advise.  She is bleeding (thick, bright red blood) from her incision.  Advised pt to go to ER now.  She understands.

## 2012-02-11 ENCOUNTER — Ambulatory Visit (INDEPENDENT_AMBULATORY_CARE_PROVIDER_SITE_OTHER): Payer: Medicare Other | Admitting: General Surgery

## 2012-02-11 ENCOUNTER — Encounter (INDEPENDENT_AMBULATORY_CARE_PROVIDER_SITE_OTHER): Payer: Self-pay | Admitting: General Surgery

## 2012-02-11 VITALS — BP 132/86 | HR 71 | Temp 97.2°F | Resp 16 | Ht 67.0 in | Wt 185.4 lb

## 2012-02-11 DIAGNOSIS — Z5189 Encounter for other specified aftercare: Secondary | ICD-10-CM

## 2012-02-11 DIAGNOSIS — Z4889 Encounter for other specified surgical aftercare: Secondary | ICD-10-CM

## 2012-02-11 NOTE — Progress Notes (Signed)
Subjective:     Patient ID: Ashley [name], female   DOB: 05-Feb-1965, 47 y.o.   MRN: [id number]  HPI She follows up s/p excision of abdominal and posterior leg masses.  Path benign lipoma.  The leg incision opened slightly but is almost now completely healed  Without any drainage or discomfort.  She has no complaints but has several questions about weight loss surgery  Review of Systems     Objective:   Physical Exam Her incisions are healing fine without sign of infection her abdominal incision is completely healed. Her posterior thigh incision opened up for about 3-4 mm but is almost completely healed without any drainage.    Assessment:     Status post excision of abdominal wall mass and leg mass She's doing well and has no complaints. Her incisions are healing well and her pathology is benign she has no evidence of any postoperative complications. She can increase activity as tolerated and followup on a when necessary basis.  She is not yet qualify for bariatric surgery but we did discuss some basic nutrition and dietary recommendations as well as some activity recommendations for continued weight loss.     Plan:     followup prn.

## 2012-10-13 ENCOUNTER — Other Ambulatory Visit: Payer: Self-pay

## 2012-10-13 DIAGNOSIS — Z1231 Encounter for screening mammogram for malignant neoplasm of breast: Secondary | ICD-10-CM

## 2012-12-01 ENCOUNTER — Ambulatory Visit
Admission: RE | Admit: 2012-12-01 | Discharge: 2012-12-01 | Disposition: A | Discharge status: Home / Self Care | Payer: Medicare Other | Source: Ambulatory Visit

## 2012-12-01 DIAGNOSIS — Z1231 Encounter for screening mammogram for malignant neoplasm of breast: Secondary | ICD-10-CM

## 2013-03-05 DIAGNOSIS — R232 Flushing: Secondary | ICD-10-CM | POA: Insufficient documentation

## 2013-03-05 DIAGNOSIS — G8929 Other chronic pain: Secondary | ICD-10-CM | POA: Insufficient documentation

## 2013-03-05 DIAGNOSIS — G43909 Migraine, unspecified, not intractable, without status migrainosus: Secondary | ICD-10-CM | POA: Insufficient documentation

## 2013-07-04 DIAGNOSIS — I10 Essential (primary) hypertension: Secondary | ICD-10-CM | POA: Insufficient documentation

## 2013-07-20 DIAGNOSIS — K2950 Unspecified chronic gastritis without bleeding: Secondary | ICD-10-CM | POA: Insufficient documentation

## 2013-09-07 ENCOUNTER — Other Ambulatory Visit: Payer: Self-pay | Admitting: Oncology

## 2013-10-31 DIAGNOSIS — R0683 Snoring: Secondary | ICD-10-CM | POA: Insufficient documentation

## 2013-11-09 ENCOUNTER — Other Ambulatory Visit: Payer: Self-pay

## 2013-11-09 ENCOUNTER — Other Ambulatory Visit: Payer: Self-pay | Admitting: Family Medicine

## 2013-11-09 DIAGNOSIS — Z139 Encounter for screening, unspecified: Secondary | ICD-10-CM

## 2013-11-09 DIAGNOSIS — Z1231 Encounter for screening mammogram for malignant neoplasm of breast: Secondary | ICD-10-CM

## 2013-12-08 ENCOUNTER — Ambulatory Visit (INDEPENDENT_AMBULATORY_CARE_PROVIDER_SITE_OTHER): Payer: 59

## 2013-12-08 DIAGNOSIS — Z1231 Encounter for screening mammogram for malignant neoplasm of breast: Secondary | ICD-10-CM

## 2013-12-19 DIAGNOSIS — D496 Neoplasm of unspecified behavior of brain: Secondary | ICD-10-CM | POA: Insufficient documentation

## 2013-12-23 DIAGNOSIS — G4700 Insomnia, unspecified: Secondary | ICD-10-CM | POA: Insufficient documentation

## 2014-11-13 ENCOUNTER — Other Ambulatory Visit: Payer: Self-pay | Admitting: Family Medicine

## 2014-11-13 DIAGNOSIS — Z1231 Encounter for screening mammogram for malignant neoplasm of breast: Secondary | ICD-10-CM

## 2014-12-12 ENCOUNTER — Ambulatory Visit (HOSPITAL_BASED_OUTPATIENT_CLINIC_OR_DEPARTMENT_OTHER)
Admission: RE | Admit: 2014-12-12 | Discharge: 2014-12-12 | Disposition: A | Discharge status: Home / Self Care | Payer: Medicare (Managed Care) | Source: Ambulatory Visit | Attending: Family Medicine | Admitting: Family Medicine

## 2014-12-12 DIAGNOSIS — Z1231 Encounter for screening mammogram for malignant neoplasm of breast: Secondary | ICD-10-CM | POA: Diagnosis present

## 2014-12-13 ENCOUNTER — Ambulatory Visit: Payer: 59

## 2015-04-17 DIAGNOSIS — M542 Cervicalgia: Secondary | ICD-10-CM | POA: Insufficient documentation

## 2015-07-18 DIAGNOSIS — E1159 Type 2 diabetes mellitus with other circulatory complications: Secondary | ICD-10-CM | POA: Insufficient documentation

## 2015-11-07 ENCOUNTER — Other Ambulatory Visit (HOSPITAL_BASED_OUTPATIENT_CLINIC_OR_DEPARTMENT_OTHER): Payer: Self-pay | Admitting: Family Medicine

## 2015-11-07 DIAGNOSIS — Z1231 Encounter for screening mammogram for malignant neoplasm of breast: Secondary | ICD-10-CM

## 2015-12-17 ENCOUNTER — Ambulatory Visit (HOSPITAL_BASED_OUTPATIENT_CLINIC_OR_DEPARTMENT_OTHER): Payer: Medicare (Managed Care)

## 2015-12-25 ENCOUNTER — Ambulatory Visit (HOSPITAL_BASED_OUTPATIENT_CLINIC_OR_DEPARTMENT_OTHER)
Admission: RE | Admit: 2015-12-25 | Discharge: 2015-12-25 | Disposition: A | Discharge status: Home / Self Care | Payer: Medicare (Managed Care) | Source: Ambulatory Visit | Attending: Family Medicine | Admitting: Family Medicine

## 2015-12-25 DIAGNOSIS — Z1231 Encounter for screening mammogram for malignant neoplasm of breast: Secondary | ICD-10-CM | POA: Insufficient documentation

## 2016-07-28 ENCOUNTER — Encounter (HOSPITAL_COMMUNITY): Payer: Self-pay | Admitting: Emergency Medicine

## 2016-07-28 ENCOUNTER — Emergency Department (HOSPITAL_COMMUNITY)
Admission: EM | Admit: 2016-07-28 | Discharge: 2016-07-28 | Disposition: A | Discharge status: Home / Self Care | Payer: Medicare (Managed Care) | Attending: Emergency Medicine | Admitting: Emergency Medicine

## 2016-07-28 DIAGNOSIS — Z79899 Other long term (current) drug therapy: Secondary | ICD-10-CM | POA: Insufficient documentation

## 2016-07-28 DIAGNOSIS — Z87891 Personal history of nicotine dependence: Secondary | ICD-10-CM | POA: Diagnosis not present

## 2016-07-28 DIAGNOSIS — I10 Essential (primary) hypertension: Secondary | ICD-10-CM | POA: Insufficient documentation

## 2016-07-28 DIAGNOSIS — G43009 Migraine without aura, not intractable, without status migrainosus: Secondary | ICD-10-CM | POA: Diagnosis not present

## 2016-07-28 DIAGNOSIS — Z7982 Long term (current) use of aspirin: Secondary | ICD-10-CM | POA: Diagnosis not present

## 2016-07-28 DIAGNOSIS — R51 Headache: Secondary | ICD-10-CM | POA: Diagnosis present

## 2016-07-28 DIAGNOSIS — Z791 Long term (current) use of non-steroidal anti-inflammatories (NSAID): Secondary | ICD-10-CM | POA: Insufficient documentation

## 2016-07-28 MED ORDER — KETOROLAC TROMETHAMINE 30 MG/ML IJ SOLN
30.0000 mg | Freq: Once | INTRAMUSCULAR | Status: AC
  Administered 2016-07-28: 30 mg via INTRAMUSCULAR
  Filled 2016-07-28: qty 1

## 2016-07-28 MED ORDER — DIPHENHYDRAMINE HCL 50 MG/ML IJ SOLN
12.5000 mg | Freq: Once | INTRAMUSCULAR | Status: AC
  Administered 2016-07-28: 12.5 mg via INTRAMUSCULAR

## 2016-07-28 MED ORDER — PROCHLORPERAZINE EDISYLATE 5 MG/ML IJ SOLN
10.0000 mg | Freq: Once | INTRAMUSCULAR | Status: AC
  Administered 2016-07-28: 10 mg via INTRAMUSCULAR
  Filled 2016-07-28: qty 2

## 2016-07-28 MED ORDER — DIPHENHYDRAMINE HCL 50 MG/ML IJ SOLN
25.0000 mg | Freq: Once | INTRAMUSCULAR | Status: DC
  Filled 2016-07-28: qty 1
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
## 2016-07-28 NOTE — ED Notes (Signed)
Patient is alert and oriented x3.  She was given DC instructions and follow up visit instructions.  Patient gave verbal understanding. She was DC ambulatory under her own power to home.  V/S stable.  He was not showing any signs of distress on DC 

## 2016-07-28 NOTE — ED Triage Notes (Signed)
Patient is complaining about neck pain and having migraines. Patient states this started yesterday. Patient keeps falling asleep while trying to triage her. Patient says she been taking her pain medication but thinks that her body is resistant to the medication.

## 2016-07-28 NOTE — Discharge Instructions (Signed)
If headache or neck pain continues, you may take ibuprofen as needed. Follow-up with your primary care provider if neck pain continues. Return to the emergency department if you develop fever, chills, rash, worsening pain, or any new or worsening symptoms.

## 2017-01-09 ENCOUNTER — Other Ambulatory Visit: Payer: Self-pay

## 2017-01-09 ENCOUNTER — Emergency Department (HOSPITAL_COMMUNITY)
Admission: EM | Admit: 2017-01-09 | Discharge: 2017-01-09 | Disposition: A | Discharge status: Home / Self Care | Payer: Medicare Other | Attending: Physician Assistant | Admitting: Physician Assistant

## 2017-01-09 ENCOUNTER — Encounter (HOSPITAL_COMMUNITY): Payer: Self-pay | Admitting: Emergency Medicine

## 2017-01-09 ENCOUNTER — Emergency Department (HOSPITAL_COMMUNITY): Payer: Medicare Other

## 2017-01-09 DIAGNOSIS — Z79899 Other long term (current) drug therapy: Secondary | ICD-10-CM | POA: Insufficient documentation

## 2017-01-09 DIAGNOSIS — K219 Gastro-esophageal reflux disease without esophagitis: Secondary | ICD-10-CM | POA: Insufficient documentation

## 2017-01-09 DIAGNOSIS — S8001XA Contusion of right knee, initial encounter: Secondary | ICD-10-CM | POA: Insufficient documentation

## 2017-01-09 DIAGNOSIS — Y9389 Activity, other specified: Secondary | ICD-10-CM | POA: Insufficient documentation

## 2017-01-09 DIAGNOSIS — Y999 Unspecified external cause status: Secondary | ICD-10-CM | POA: Diagnosis not present

## 2017-01-09 DIAGNOSIS — Z79890 Hormone replacement therapy (postmenopausal): Secondary | ICD-10-CM | POA: Insufficient documentation

## 2017-01-09 DIAGNOSIS — W108XXA Fall (on) (from) other stairs and steps, initial encounter: Secondary | ICD-10-CM | POA: Insufficient documentation

## 2017-01-09 DIAGNOSIS — I10 Essential (primary) hypertension: Secondary | ICD-10-CM | POA: Diagnosis not present

## 2017-01-09 DIAGNOSIS — M545 Low back pain: Secondary | ICD-10-CM | POA: Diagnosis not present

## 2017-01-09 DIAGNOSIS — Z7982 Long term (current) use of aspirin: Secondary | ICD-10-CM | POA: Insufficient documentation

## 2017-01-09 DIAGNOSIS — S93401A Sprain of unspecified ligament of right ankle, initial encounter: Secondary | ICD-10-CM | POA: Diagnosis not present

## 2017-01-09 DIAGNOSIS — S99911A Unspecified injury of right ankle, initial encounter: Secondary | ICD-10-CM | POA: Diagnosis present

## 2017-01-09 DIAGNOSIS — Y92018 Other place in single-family (private) house as the place of occurrence of the external cause: Secondary | ICD-10-CM | POA: Diagnosis not present

## 2017-01-09 DIAGNOSIS — Z87891 Personal history of nicotine dependence: Secondary | ICD-10-CM | POA: Diagnosis not present

## 2017-01-09 DIAGNOSIS — W19XXXA Unspecified fall, initial encounter: Secondary | ICD-10-CM

## 2017-01-09 NOTE — ED Provider Notes (Signed)
[facility] DEPT Provider Note   CSN: [id number] Arrival date & time: 01/09/17  1352     History   Chief Complaint Chief Complaint  Patient presents with  . Fall  . Knee Pain  . Ankle Pain    HPI Ashley [name] is a 51 y.o. female with a PMHx of anemia, HTN, headaches, benign brain tumor, and GERD, who presents to the ED with complaints of mechanical fall that occurred around 8:30 AM, approximately 7 hours prior to evaluation.  Patient states that she was coming down her steps when her shoe caught on a small crack in 1 of the steps, causing her to twist her ankle and fell forward into her car, striking her right knee and twisting her lower back.  She was initially able to ambulate afterwards, but over the following few hours, her pain started so she decided to come "get checked out".  She complains of gradual onset 8/10 constant throbbing nonradiating right ankle and knee with walking and moving the joints, and with no treatments tried prior to arrival.  She reports associated swelling in the ankle, as well as mild right-sided lower back pain.  She denies any bruises, abrasions, numbness, tingling, focal weakness, or any other injury sustained during the fall, or any other complaints at this time.  She does not have an orthopedist.    The history is provided by the patient and medical records. No language interpreter was used.  Fall   Knee Pain   Pertinent negatives include no numbness and no tingling.  Ankle Pain   The incident occurred 6 to 12 hours ago. The incident occurred at home. The injury mechanism was a fall. The pain is present in the right knee and right ankle. The quality of the pain is described as throbbing. The pain is at a severity of 8/10. The pain is moderate. The pain has been constant since onset. Pertinent negatives include no numbness, no loss of sensation and no tingling. She reports no foreign bodies present. The symptoms are  aggravated by bearing weight and activity. She has tried nothing for the symptoms. The treatment provided no relief.    Past Medical History:  Diagnosis Date  . Anemia   . Brain tumor (benign) ([place])    has a tumor watching-no surgery=no tx   . Eczema   . GERD (gastroesophageal reflux disease)   . Headache(784.0)   . Heart murmur    dx as a child, no probems as adult  . Hypertension   . SVD (spontaneous vaginal delivery)    x 2    There are no active problems to display for this patient.   Past Surgical History:  Procedure Laterality Date  . ABDOMINAL HYSTERECTOMY    . LAPAROSCOPIC ASSISTED VAGINAL HYSTERECTOMY  08/04/2011   Procedure: LAPAROSCOPIC ASSISTED VAGINAL HYSTERECTOMY;  Surgeon: Melina Schools, MD;  Location: [facility];  Service: Gynecology;  Laterality: N/A;  . MASS EXCISION  01/15/2012   Procedure: EXCISION MASS;  Surgeon: Madilyn Hook, DO;  Location: [facility];  Service: General;  Laterality: Right;  Excision of lower abdominal wall mass & excision  Right posterior upper leg mass  . TUBAL LIGATION  2002  . VENTRAL HERNIA REPAIR  01/15/2012   Procedure: HERNIA REPAIR VENTRAL ADULT;  Surgeon: Madilyn Hook, DO;  Location: [facility];  Service: General;  Laterality: N/A;  Excision lower abdomen soft tissue mass (no hernia)  . WISDOM TOOTH EXTRACTION  OB History    No data available       Home Medications    Prior to Admission medications   Medication Sig Start Date End Date Taking? Authorizing Provider  amitriptyline (ELAVIL) 25 MG tablet  09/08/11   [provider]  aspirin 325 MG tablet Take 325 mg by mouth daily.    [provider]  clindamycin (CLINDAGEL) 1 % gel Apply 1 application topically 2 (two) times daily.    [provider]  esomeprazole (NEXIUM) 40 MG capsule Take 40 mg by mouth daily before breakfast.    [provider]  estradiol (ESTRACE) 0.5 MG tablet Take 0.5 mg by mouth daily.     [provider]  HYDROcodone-acetaminophen (NORCO) 5-325 MG per tablet Take 1 tablet by mouth every 4 (four) hours as needed for pain. 01/15/12   Madilyn Hook, DO  lisinopril (PRINIVIL,ZESTRIL) 10 MG tablet Take 10 mg by mouth daily.    [provider]  metoprolol succinate (TOPROL-XL) 25 MG 24 hr tablet Take 25 mg by mouth daily.    [provider]  minocycline (MINOCIN,DYNACIN) 100 MG capsule Take 100 mg by mouth 2 (two) times daily.    [provider]  naproxen sodium (ANAPROX) 550 MG tablet  10/15/11   [provider]  zolmitriptan (ZOMIG) 5 MG tablet Take 5 mg by mouth daily as needed. For migraines    [provider]    Family History Family History  Problem Relation Age of Onset  . Cancer Maternal Aunt        Ovarian    Social History Social History   Tobacco Use  . Smoking status: Former Smoker    Packs/day: 0.25    Years: 4.00    Pack years: 1.00    Types: Cigarettes    Last attempt to quit: 01/20/1994    Years since quitting: 22.9  . Smokeless tobacco: Never Used  Substance Use Topics  . Alcohol use: Yes    Comment: socially - wine  . Drug use: No     Allergies   Penicillins   Review of Systems Review of Systems  HENT: Negative for facial swelling (no head inj).   Musculoskeletal: Positive for arthralgias, back pain and joint swelling.  Skin: Negative for color change and wound.  Allergic/Immunologic: Negative for immunocompromised state.  Neurological: Negative for tingling, syncope, weakness and numbness.  Psychiatric/Behavioral: Negative for confusion.     Physical Exam Updated Vital Signs BP (!) 143/95 (BP Location: Left Arm)   Pulse 81   Temp 98.7 F (37.1 C) (Oral)   Resp 18   LMP 07/13/2011   SpO2 98%   Physical Exam  Constitutional: She is oriented to person, place, and time. Vital signs are normal. She appears well-developed and well-nourished.  Non-toxic appearance. No distress.    Afebrile, nontoxic, NAD  HENT:  Head: Normocephalic and atraumatic.  Mouth/Throat: Mucous membranes are normal.  Eyes: Conjunctivae and EOM are normal. Right eye exhibits no discharge. Left eye exhibits no discharge.  Neck: Normal range of motion. Neck supple.  Cardiovascular: Normal rate and intact distal pulses.  Pulmonary/Chest: Effort normal. No respiratory distress.  Abdominal: Normal appearance. She exhibits no distension.  Musculoskeletal: Normal range of motion.       Right knee: She exhibits swelling. She exhibits normal range of motion, no effusion, no ecchymosis, no deformity, no laceration, no erythema, normal alignment, no LCL laxity, normal patellar mobility and no MCL laxity. Tenderness found.  Right ankle: She exhibits swelling. She exhibits normal range of motion, no ecchymosis, no deformity, no laceration and normal pulse. Tenderness. Lateral malleolus tenderness found. Achilles tendon normal.       Lumbar back: She exhibits tenderness and spasm. She exhibits normal range of motion, no bony tenderness and no deformity.  -Lumbar spine with FROM intact without spinous process TTP, no bony stepoffs or deformities, with mild R sided paraspinous muscle TTP and slight muscle spasm.  -R ankle with FROM intact, with mild swelling, no crepitus or deformity, with mild TTP of the lateral malleolus, but no TTP or swelling of fore foot or calf. No break in skin. No bruising or erythema. No warmth. Achilles intact. Good pedal pulse and cap refill of all toes. Wiggling toes without difficulty.  -R knee with FROM intact, with diffuse joint line and prepatellar TTP, mild prepatellar swelling, no effusion/deformity, no bruising or erythema, no warmth, no abnormal alignment or patellar mobility, no varus/valgus laxity, neg anterior drawer test, no crepitus.  Strength and sensation grossly intact in all extremities, distal pulses intact, compartments soft   Neurological: She is alert and  oriented to person, place, and time. She has normal strength. No sensory deficit.  Skin: Skin is warm, dry and intact. No rash noted.  Psychiatric: She has a normal mood and affect. Her behavior is normal.  Nursing note and vitals reviewed.    ED Treatments / Results  Labs (all labs ordered are listed, but only abnormal results are displayed) Labs Reviewed - No data to display  EKG  EKG Interpretation None       Radiology Dg Ankle Complete Right  Result Date: 01/09/2017 CLINICAL DATA:  Fall with ankle joint pain EXAM: RIGHT ANKLE - COMPLETE 3+ VIEW COMPARISON:  None. FINDINGS: There is no evidence of fracture, dislocation, or joint effusion. There is no evidence of arthropathy or other focal bone abnormality. Soft tissues are unremarkable. IMPRESSION: Negative. Electronically Signed   By: Donavan Foil M.D.   On: 01/09/2017 15:15   Dg Knee Complete 4 Views Right  Result Date: 01/09/2017 CLINICAL DATA:  Fall, medial and anterior knee pain EXAM: RIGHT KNEE - COMPLETE 4+ VIEW COMPARISON:  None. FINDINGS: No fracture or malalignment. Joint spaces are maintained. Mild spurring at the superior and inferior pole of patella. No large effusion IMPRESSION: No acute osseous abnormality. Electronically Signed   By: Donavan Foil M.D.   On: 01/09/2017 15:17    Procedures Procedures (including critical care time)  Medications Ordered in ED Medications - No data to display   Initial Impression / Assessment and Plan / ED Course  I have reviewed the triage vital signs and the nursing notes.  Pertinent labs & imaging results that were available during my care of the patient were reviewed by me and considered in my medical decision making (see chart for details).     51 y.o. female here after mechanical fall with c/o R ankle and knee pain, also mild R lumbar pain. On exam, mild R sided paraspinous muscle TTP in lumbar area, no midline spinal tenderness, extremities NVI with soft  compartments, no red flag s/sx of cauda equina syndrome; mild R ankle TTP along lateral malleolus with mild swelling, no bruising or crepitus; mild R knee tenderness diffusely, slight swelling to prepatellar area, no bruising, FROM intact in all joints. Xrays negative for fracture. Likely ankle sprain and knee contusion, as well as mild lumbar paraspinous muscle strain; doubt need for imaging of her spine. Will  give knee sleeve and ASO ankle brace, as well as crutches for comfort. Advised RICE, tylenol/motrin for pain, f/up with ortho in 1-2wks for recheck. I explained the diagnosis and have given explicit precautions to return to the ER including for any other new or worsening symptoms. The patient understands and accepts the medical plan as it's been dictated and I have answered their questions. Discharge instructions concerning home care and prescriptions have been given. The patient is STABLE and is discharged to home in good condition.    Final Clinical Impressions(s) / ED Diagnoses   Final diagnoses:  Sprain of right ankle, unspecified ligament, initial encounter  Contusion of right knee, initial encounter  Fall, initial encounter    ED Discharge Orders    [address], [place], [place] 01/09/17 1555    Macarthur Critchley, MD 01/09/17 2057

## 2017-01-09 NOTE — Discharge Instructions (Signed)
Wear knee sleeve and ankle brace for at least 2 weeks for stabilization of knee and ankle. Use crutches as needed for comfort. Ice and elevate ankle and knee throughout the day, using ice pack for no more than 20 minutes every hour. Alternate between tylenol and motrin for pain relief. Call orthopedic follow up today or tomorrow to schedule followup appointment for recheck of ongoing ankle and knee pain in 1-2 weeks. Return to the ER for changes or worsening symptoms.

## 2017-01-09 NOTE — ED Triage Notes (Signed)
Pt complaint of right knee and right ankle pain post fall on stair this am; event 0830.

## 2017-03-25 DIAGNOSIS — M47812 Spondylosis without myelopathy or radiculopathy, cervical region: Secondary | ICD-10-CM | POA: Insufficient documentation

## 2017-03-25 DIAGNOSIS — M7918 Myalgia, other site: Secondary | ICD-10-CM | POA: Insufficient documentation

## 2017-03-25 DIAGNOSIS — M47816 Spondylosis without myelopathy or radiculopathy, lumbar region: Secondary | ICD-10-CM | POA: Insufficient documentation

## 2017-03-25 DIAGNOSIS — M4802 Spinal stenosis, cervical region: Secondary | ICD-10-CM | POA: Insufficient documentation

## 2017-04-10 ENCOUNTER — Encounter (HOSPITAL_COMMUNITY): Payer: Self-pay | Admitting: Emergency Medicine

## 2017-04-10 ENCOUNTER — Emergency Department (HOSPITAL_COMMUNITY)
Admission: EM | Admit: 2017-04-10 | Discharge: 2017-04-10 | Disposition: A | Discharge status: Home / Self Care | Payer: Medicare (Managed Care) | Attending: Emergency Medicine | Admitting: Emergency Medicine

## 2017-04-10 ENCOUNTER — Emergency Department (HOSPITAL_COMMUNITY): Payer: Medicare (Managed Care)

## 2017-04-10 DIAGNOSIS — Z87891 Personal history of nicotine dependence: Secondary | ICD-10-CM | POA: Diagnosis not present

## 2017-04-10 DIAGNOSIS — Y939 Activity, unspecified: Secondary | ICD-10-CM | POA: Insufficient documentation

## 2017-04-10 DIAGNOSIS — R040 Epistaxis: Secondary | ICD-10-CM | POA: Insufficient documentation

## 2017-04-10 DIAGNOSIS — W208XXA Other cause of strike by thrown, projected or falling object, initial encounter: Secondary | ICD-10-CM | POA: Insufficient documentation

## 2017-04-10 DIAGNOSIS — I10 Essential (primary) hypertension: Secondary | ICD-10-CM | POA: Diagnosis not present

## 2017-04-10 DIAGNOSIS — Z79899 Other long term (current) drug therapy: Secondary | ICD-10-CM | POA: Diagnosis not present

## 2017-04-10 DIAGNOSIS — Z7982 Long term (current) use of aspirin: Secondary | ICD-10-CM | POA: Diagnosis not present

## 2017-04-10 DIAGNOSIS — Y929 Unspecified place or not applicable: Secondary | ICD-10-CM | POA: Diagnosis not present

## 2017-04-10 DIAGNOSIS — S0033XA Contusion of nose, initial encounter: Secondary | ICD-10-CM | POA: Diagnosis not present

## 2017-04-10 DIAGNOSIS — Y999 Unspecified external cause status: Secondary | ICD-10-CM | POA: Insufficient documentation

## 2017-04-10 DIAGNOSIS — S0993XA Unspecified injury of face, initial encounter: Secondary | ICD-10-CM | POA: Diagnosis present

## 2017-04-10 MED ORDER — IBUPROFEN 200 MG PO TABS
600.0000 mg | ORAL_TABLET | Freq: Once | ORAL | Status: AC
Start: 2017-04-10 — End: 2017-04-10
  Administered 2017-04-10: 600 mg via ORAL
  Filled 2017-04-10: qty 3

## 2017-04-10 MED ORDER — OXYMETAZOLINE HCL 0.05 % NA SOLN
1.0000 | Freq: Once | NASAL | Status: AC
  Administered 2017-04-10: 1 via NASAL
  Filled 2017-04-10: qty 15
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
## 2017-04-10 NOTE — Discharge Instructions (Signed)
Apply ice to the area, take your medication for pain. Follow up with your doctor. Return here for worsening symptoms.
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
## 2017-04-22 DIAGNOSIS — M4316 Spondylolisthesis, lumbar region: Secondary | ICD-10-CM | POA: Insufficient documentation

## 2017-12-03 DIAGNOSIS — M0579 Rheumatoid arthritis with rheumatoid factor of multiple sites without organ or systems involvement: Secondary | ICD-10-CM | POA: Insufficient documentation

## 2018-02-05 ENCOUNTER — Institutional Professional Consult (permissible substitution): Payer: Medicare (Managed Care) | Admitting: Plastic Surgery

## 2018-04-26 ENCOUNTER — Ambulatory Visit (HOSPITAL_COMMUNITY): Payer: Self-pay | Admitting: Psychiatry

## 2018-07-20 ENCOUNTER — Other Ambulatory Visit: Payer: Self-pay | Admitting: Family Medicine

## 2018-07-21 ENCOUNTER — Other Ambulatory Visit: Payer: Self-pay | Admitting: Family Medicine

## 2018-07-21 DIAGNOSIS — M8580 Other specified disorders of bone density and structure, unspecified site: Secondary | ICD-10-CM

## 2018-11-30 DIAGNOSIS — F418 Other specified anxiety disorders: Secondary | ICD-10-CM | POA: Insufficient documentation

## 2018-12-18 IMAGING — DX DG ANKLE COMPLETE 3+V*R*
3 series · 3 of 3 positions shown · non-contrast
Comparison: None.

CLINICAL DATA: Fall with ankle joint pain

EXAM:
RIGHT ANKLE - COMPLETE 3+ VIEW

[ankle ap]
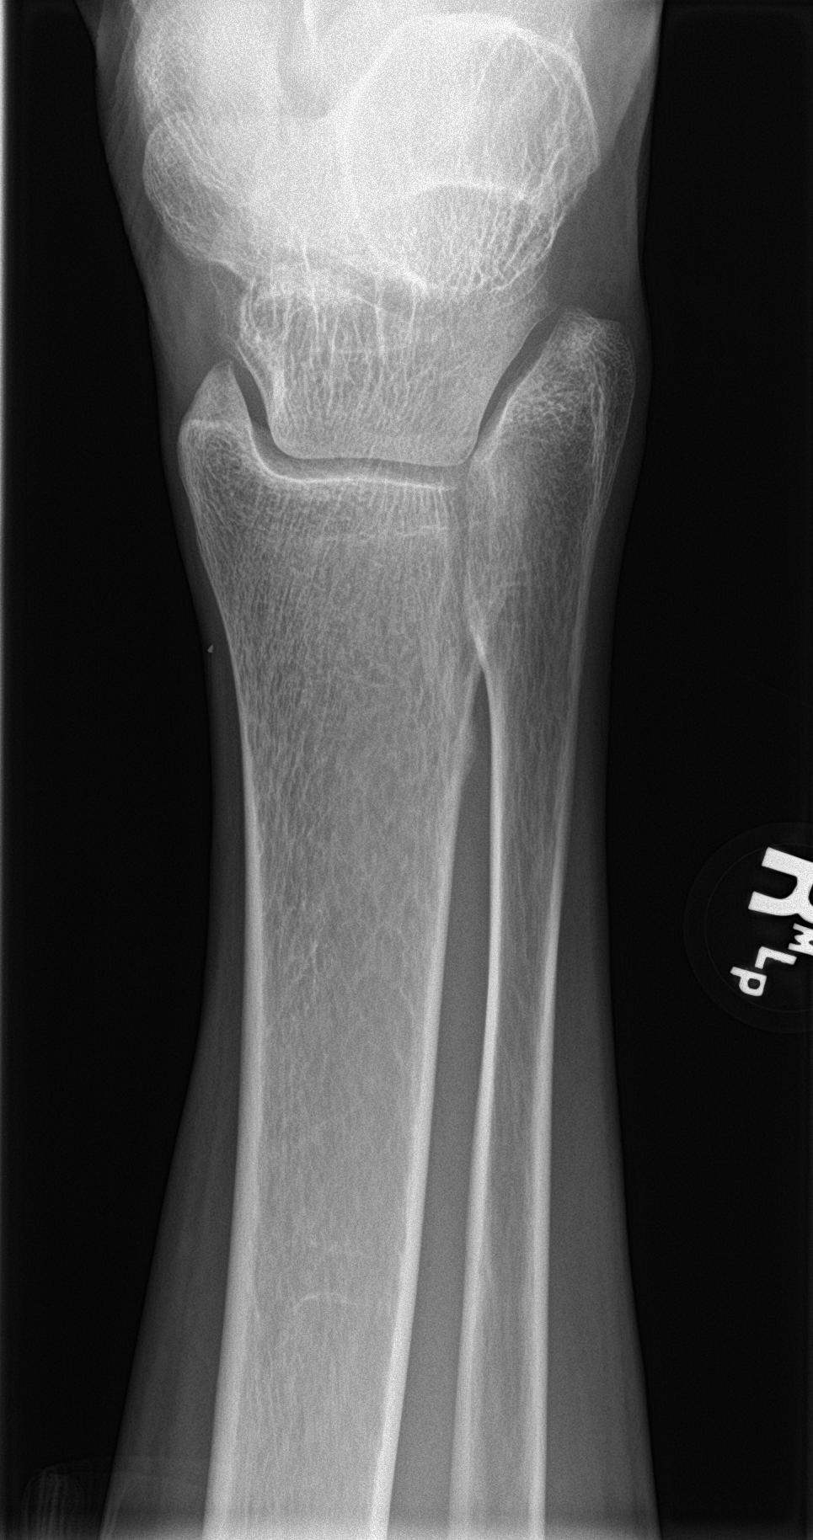

[ankle obl]
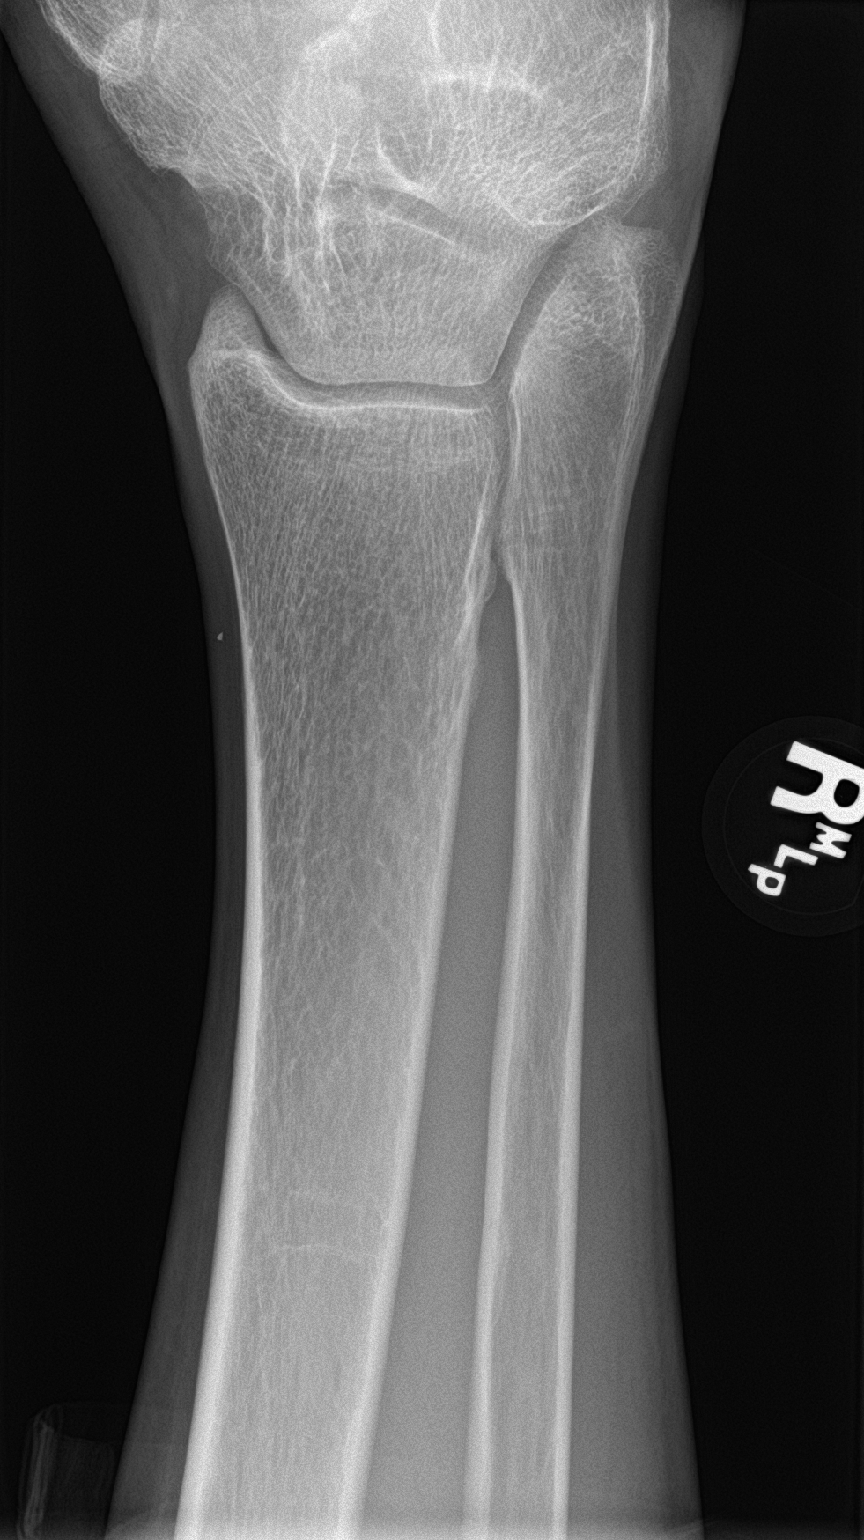

[ankle lat]
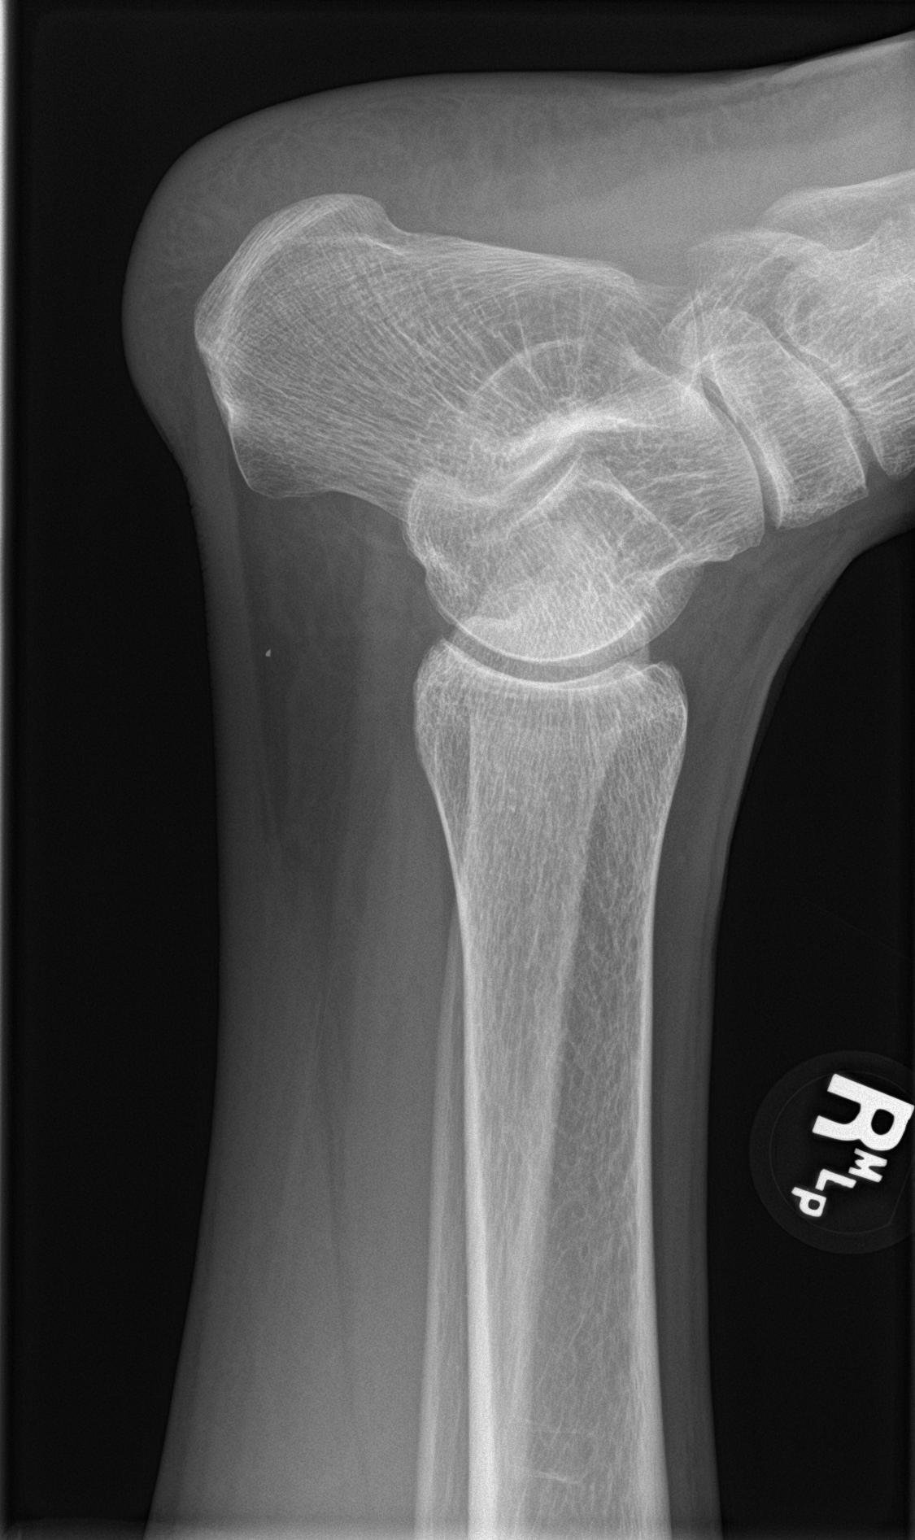

[3 of 3 positions shown; findings below may reference images not displayed]

FINDINGS: There is no evidence of fracture, dislocation, or joint effusion.
There is no evidence of arthropathy or other focal bone abnormality.
Soft tissues are unremarkable.
IMPRESSION: Negative.

## 2019-06-15 ENCOUNTER — Ambulatory Visit
Admission: EM | Admit: 2019-06-15 | Discharge: 2019-06-15 | Disposition: A | Discharge status: Home / Self Care | Payer: Medicare Other | Attending: Emergency Medicine | Admitting: Emergency Medicine

## 2019-06-15 DIAGNOSIS — J039 Acute tonsillitis, unspecified: Secondary | ICD-10-CM | POA: Diagnosis not present

## 2019-06-15 DIAGNOSIS — J029 Acute pharyngitis, unspecified: Secondary | ICD-10-CM | POA: Diagnosis not present

## 2019-06-15 LAB — POCT RAPID STREP A (OFFICE): Rapid Strep A Screen: NEGATIVE

## 2019-06-15 MED ORDER — AMOXICILLIN 250 MG/5ML PO SUSR: 500.0000 mg | Freq: Three times a day (TID) | ORAL | 0 refills | Status: AC

## 2019-06-15 NOTE — ED Provider Notes (Signed)
[facility]    CSN: [id number] Arrival date & time: 06/15/19  1048      History   Chief Complaint Chief Complaint  Patient presents with   Sore Throat    HPI Ashley [name] is a 54 y.o. female with history of brain tumor, eczema, GERD, hypertension presenting for sore throat times Saturday.  States she has had voice hoarseness, difficulty swallowing.  States she is not been able to take her home medications due to this.  Has tried gargling salt water, cough drops without relief.  Patient denies choking, difficulty breathing, chest pain, fever.  No myalgias, arthralgias, known sick contacts.   Past Medical History:  Diagnosis Date   Anemia    Brain tumor (benign) ([place])    has a tumor watching-no surgery=no tx    Eczema    GERD (gastroesophageal reflux disease)    Headache(784.0)    Heart murmur    dx as a child, no probems as adult   Hypertension    SVD (spontaneous vaginal delivery)    x 2    There are no problems to display for this patient.   Past Surgical History:  Procedure Laterality Date   ABDOMINAL HYSTERECTOMY     LAPAROSCOPIC ASSISTED VAGINAL HYSTERECTOMY  08/04/2011   Procedure: LAPAROSCOPIC ASSISTED VAGINAL HYSTERECTOMY;  Surgeon: Melina Schools, MD;  Location: [facility];  Service: Gynecology;  Laterality: N/A;   MASS EXCISION  01/15/2012   Procedure: EXCISION MASS;  Surgeon: Madilyn Hook, DO;  Location: [place];  Service: General;  Laterality: Right;  Excision of lower abdominal wall mass & excision  Right posterior upper leg mass   TUBAL LIGATION  2002   VENTRAL HERNIA REPAIR  01/15/2012   Procedure: HERNIA REPAIR VENTRAL ADULT;  Surgeon: Madilyn Hook, DO;  Location: [place];  Service: General;  Laterality: N/A;  Excision lower abdomen soft tissue mass (no hernia)   WISDOM TOOTH EXTRACTION      OB History   No obstetric history on file.      Home Medications    Prior to Admission  medications   Medication Sig Start Date End Date Taking? Authorizing Provider  gabapentin (NEURONTIN) 300 MG capsule Take 300 mg by mouth 2 (two) times daily.   Yes [provider]  amitriptyline (ELAVIL) 25 MG tablet  09/08/11   [provider]  amoxicillin (AMOXIL) 250 MG/5ML suspension Take 10 mLs (500 mg total) by mouth 3 (three) times daily for 7 days. 06/15/19 06/22/19  Hall-Potvin, [place], PA-C  aspirin 325 MG tablet Take 325 mg by mouth daily.    [provider]  esomeprazole (NEXIUM) 40 MG capsule Take 40 mg by mouth daily before breakfast.    [provider]  metoprolol succinate (TOPROL-XL) 25 MG 24 hr tablet Take 25 mg by mouth daily.    [provider]  minocycline (MINOCIN,DYNACIN) 100 MG capsule Take 100 mg by mouth 2 (two) times daily.    [provider]  naproxen sodium (ANAPROX) 550 MG tablet  10/15/11   [provider]  zolmitriptan (ZOMIG) 5 MG tablet Take 5 mg by mouth daily as needed. For migraines    [provider]    Family History Family History  Problem Relation Age of Onset   Cancer Maternal Aunt        Ovarian    Social History Social History   Tobacco Use   Smoking status: Former Smoker    Packs/day: 0.25  Years: 4.00    Pack years: 1.00    Types: Cigarettes    Quit date: 01/20/1994    Years since quitting: 25.4   Smokeless tobacco: Never Used  Substance Use Topics   Alcohol use: Yes    Comment: socially - wine   Drug use: No     Allergies   Penicillins   Review of Systems As per HPI   Physical Exam Triage Vital Signs ED Triage Vitals  Enc Vitals Group     BP      Pulse      Resp      Temp      Temp src      SpO2      Weight      Height      Head Circumference      Peak Flow      Pain Score      Pain Loc      Pain Edu?      Excl. in [place]?    No data found.  Updated Vital Signs BP (!) 167/90 (BP Location: Left Arm)    Pulse 68    Temp 98.8 F (37.1  C) (Oral)    Resp 18    LMP 07/13/2011    SpO2 95%   Visual Acuity Right Eye Distance:   Left Eye Distance:   Bilateral Distance:    Right Eye Near:   Left Eye Near:    Bilateral Near:     Physical Exam Constitutional:      General: She is not in acute distress. HENT:     Head: Normocephalic and atraumatic.     Jaw: There is normal jaw occlusion. No tenderness or pain on movement.     Right Ear: Hearing, tympanic membrane, ear canal and external ear normal. No tenderness. No mastoid tenderness.     Left Ear: Hearing, tympanic membrane, ear canal and external ear normal. No tenderness. No mastoid tenderness.     Nose: No nasal deformity, septal deviation or nasal tenderness.     Right Turbinates: Not swollen or pale.     Left Turbinates: Not swollen or pale.     Right Sinus: No maxillary sinus tenderness or frontal sinus tenderness.     Left Sinus: No maxillary sinus tenderness or frontal sinus tenderness.     Mouth/Throat:     Lips: Pink. No lesions.     Mouth: Mucous membranes are moist. No injury.     Pharynx: Oropharynx is clear. Uvula midline. No posterior oropharyngeal erythema or uvula swelling.     Tonsils: Tonsillar exudate present. 3+ on the right. 2+ on the left.  Cardiovascular:     Rate and Rhythm: Normal rate.  Pulmonary:     Effort: Pulmonary effort is normal.  Musculoskeletal:     Cervical back: Normal range of motion and neck supple. No muscular tenderness.  Lymphadenopathy:     Cervical: No cervical adenopathy.  Neurological:     Mental Status: She is alert and oriented to person, place, and time.      UC Treatments / Results  Labs (all labs ordered are listed, but only abnormal results are displayed) Labs Reviewed  POCT RAPID STREP A (OFFICE) - Normal  CULTURE, GROUP A STREP [facility])    EKG   Radiology No results found.  Procedures Procedures (including critical care time)  Medications Ordered in UC Medications - No data to  display  Initial Impression / Assessment and Plan / UC Course  I have reviewed the triage vital signs and the nursing notes.  Pertinent labs & imaging results that were available during my care of the patient were reviewed by me and considered in my medical decision making (see chart for details).     Patient afebrile, nontoxic in office today.  Rapid strep negative, culture pending.  Will treat empirically for tonsillitis.  Amoxicillin sent in liquid form for improved compliance.  Strict ER return precautions discussed, patient verbalized understanding and is agreeable to plan. Final Clinical Impressions(s) / UC Diagnoses   Final diagnoses:  Sore throat  Acute tonsillitis, unspecified etiology     Discharge Instructions     Your rapid strep test was negative today.  The culture is pending.  Please look on your MyChart for test results.   Take antibiotic twice daily with food.  Please continue Tylenol and/or Ibuprofen as needed for fever, pain.  May try warm salt water gargles, cepacol lozenges, throat spray, warm tea or water with lemon/honey, or OTC cold relief medicine for throat discomfort.   For congestion: take a daily anti-histamine like Zyrtec, Claritin, and a oral decongestant to help with post nasal drip that may be irritating your throat.   It is important to stay hydrated: drink plenty of fluids (primarily water) to keep your throat moisturized and help further relieve irritation/discomfort.   Go to ER for worsening pain, swelling, difficulty swallowing, talking, breathing, fever    ED Prescriptions    Medication Sig Dispense Auth. Provider   amoxicillin (AMOXIL) 250 MG/5ML suspension Take 10 mLs (500 mg total) by mouth 3 (three) times daily for 7 days. 210 mL Hall-Potvin, [place], PA-C     PDMP not reviewed this encounter.   Hall-Potvin, [place], [place] 06/15/19 1233

## 2019-06-15 NOTE — Discharge Instructions (Signed)
Your rapid strep test was negative today.  The culture is pending.  Please look on your MyChart for test results.   Take antibiotic twice daily with food.  Please continue Tylenol and/or Ibuprofen as needed for fever, pain.  May try warm salt water gargles, cepacol lozenges, throat spray, warm tea or water with lemon/honey, or OTC cold relief medicine for throat discomfort.   For congestion: take a daily anti-histamine like Zyrtec, Claritin, and a oral decongestant to help with post nasal drip that may be irritating your throat.   It is important to stay hydrated: drink plenty of fluids (primarily water) to keep your throat moisturized and help further relieve irritation/discomfort.   Go to ER for worsening pain, swelling, difficulty swallowing, talking, breathing, fever

## 2019-06-15 NOTE — ED Triage Notes (Signed)
Pt c/o sore throat since Saturday. States having difficult swallowing and hoarse. States unable to swallow her pills. States has been gargling salt water with no relief.

## 2019-06-17 LAB — CULTURE, GROUP A STREP (THRC)

## 2019-06-28 DIAGNOSIS — E669 Obesity, unspecified: Secondary | ICD-10-CM | POA: Insufficient documentation

## 2019-10-17 DIAGNOSIS — F330 Major depressive disorder, recurrent, mild: Secondary | ICD-10-CM | POA: Insufficient documentation

## 2019-10-17 DIAGNOSIS — F419 Anxiety disorder, unspecified: Secondary | ICD-10-CM | POA: Insufficient documentation

## 2020-09-13 ENCOUNTER — Other Ambulatory Visit: Payer: Self-pay

## 2020-09-13 ENCOUNTER — Observation Stay (HOSPITAL_COMMUNITY)
Admission: EM | Admit: 2020-09-13 | Discharge: 2020-09-14 | Disposition: A | Discharge status: Home / Self Care | Payer: Medicare Other | Attending: Internal Medicine | Admitting: Internal Medicine

## 2020-09-13 ENCOUNTER — Emergency Department (HOSPITAL_COMMUNITY): Payer: Medicare Other

## 2020-09-13 ENCOUNTER — Encounter (HOSPITAL_COMMUNITY): Payer: Self-pay

## 2020-09-13 DIAGNOSIS — E119 Type 2 diabetes mellitus without complications: Secondary | ICD-10-CM | POA: Insufficient documentation

## 2020-09-13 DIAGNOSIS — Z8669 Personal history of other diseases of the nervous system and sense organs: Secondary | ICD-10-CM

## 2020-09-13 DIAGNOSIS — I10 Essential (primary) hypertension: Secondary | ICD-10-CM | POA: Insufficient documentation

## 2020-09-13 DIAGNOSIS — R079 Chest pain, unspecified: Secondary | ICD-10-CM | POA: Diagnosis present

## 2020-09-13 DIAGNOSIS — Z7982 Long term (current) use of aspirin: Secondary | ICD-10-CM | POA: Diagnosis not present

## 2020-09-13 DIAGNOSIS — E1169 Type 2 diabetes mellitus with other specified complication: Secondary | ICD-10-CM

## 2020-09-13 DIAGNOSIS — I160 Hypertensive urgency: Secondary | ICD-10-CM | POA: Diagnosis not present

## 2020-09-13 DIAGNOSIS — Z79899 Other long term (current) drug therapy: Secondary | ICD-10-CM | POA: Insufficient documentation

## 2020-09-13 DIAGNOSIS — Z20822 Contact with and (suspected) exposure to covid-19: Secondary | ICD-10-CM | POA: Diagnosis not present

## 2020-09-13 DIAGNOSIS — Z87891 Personal history of nicotine dependence: Secondary | ICD-10-CM | POA: Diagnosis not present

## 2020-09-13 DIAGNOSIS — E669 Obesity, unspecified: Secondary | ICD-10-CM

## 2020-09-13 DIAGNOSIS — R0789 Other chest pain: Secondary | ICD-10-CM | POA: Diagnosis present

## 2020-09-13 LAB — CBC WITH DIFFERENTIAL/PLATELET
Abs Immature Granulocytes: 0.01 10*3/uL (ref 0.00–0.07)
Basophils Absolute: 0.1 10*3/uL (ref 0.0–0.1)
Basophils Relative: 1 %
Eosinophils Absolute: 0.1 10*3/uL (ref 0.0–0.5)
Eosinophils Relative: 2 %
HCT: 39.2 % (ref 36.0–46.0)
Hemoglobin: 12.7 g/dL (ref 12.0–15.0)
Immature Granulocytes: 0 %
Lymphocytes Relative: 60 %
Lymphs Abs: 4.2 10*3/uL — ABNORMAL HIGH (ref 0.7–4.0)
MCH: 23.8 pg — ABNORMAL LOW (ref 26.0–34.0)
MCHC: 32.4 g/dL (ref 30.0–36.0)
MCV: 73.4 fL — ABNORMAL LOW (ref 80.0–100.0)
Monocytes Absolute: 0.5 10*3/uL (ref 0.1–1.0)
Monocytes Relative: 7 %
Neutro Abs: 2.1 10*3/uL (ref 1.7–7.7)
Neutrophils Relative %: 30 %
Platelets: 322 10*3/uL (ref 150–400)
RBC: 5.34 MIL/uL — ABNORMAL HIGH (ref 3.87–5.11)
RDW: 16.4 % — ABNORMAL HIGH (ref 11.5–15.5)
WBC: 6.9 10*3/uL (ref 4.0–10.5)
nRBC: 0.3 % — ABNORMAL HIGH (ref 0.0–0.2)

## 2020-09-13 LAB — BASIC METABOLIC PANEL
Anion gap: 5 (ref 5–15)
BUN: 10 mg/dL (ref 6–20)
CO2: 26 mmol/L (ref 22–32)
Calcium: 8.9 mg/dL (ref 8.9–10.3)
Chloride: 109 mmol/L (ref 98–111)
Creatinine, Ser: 0.76 mg/dL (ref 0.44–1.00)
GFR, Estimated: >60 mL/min (ref >60)
Glucose, Bld: 131 mg/dL — ABNORMAL HIGH (ref 70–99)
Potassium: 3.6 mmol/L (ref 3.5–5.1)
Sodium: 140 mmol/L (ref 135–145)

## 2020-09-13 LAB — TROPONIN I (HIGH SENSITIVITY)
Troponin I (High Sensitivity): 2 ng/L (ref <18)
Troponin I (High Sensitivity): 3 ng/L (ref <18)

## 2020-09-13 MED ORDER — NITROGLYCERIN 2 % TD OINT
1.0000 [in_us] | TOPICAL_OINTMENT | Freq: Four times a day (QID) | TRANSDERMAL | Status: DC
  Administered 2020-09-13 – 2020-09-14 (×2): 1 [in_us] via TOPICAL
  Filled 2020-09-13: qty 30

## 2020-09-13 MED ORDER — ASPIRIN 81 MG PO CHEW
324.0000 mg | CHEWABLE_TABLET | Freq: Once | ORAL | Status: AC
  Administered 2020-09-13: 324 mg via ORAL
  Filled 2020-09-13: qty 4

## 2020-09-13 MED ORDER — ACETAMINOPHEN 500 MG PO TABS
1000.0000 mg | ORAL_TABLET | Freq: Once | ORAL | Status: AC
  Administered 2020-09-13: 1000 mg via ORAL
  Filled 2020-09-13: qty 2

## 2020-09-13 NOTE — H&P (Signed)
History and Physical    Ashley [name] [id number] DOB: 10-27-1965 DOA: 09/13/2020  PCP: Abram Sander, MD   Patient coming from: Home  Chief Complaint: Chest pain  HPI: Ashley [name] is a 55 y.o. female with medical history significant for HTN, DMT2, migraine headaches who presents for evaluation of chest pain and elevated blood pressure. She was at the pharmacy this afternoon and checked her BP there while waiting and it was elevated with SBP of 185. The pharmacist had her sit and rest for a little while then recheck her BP and it was still elevated so he recommended that she come to the ER for evaluation. She reports she developed substernal chest pressure and pain while she was at the pharmacy.  She states he felt someone was standing on her chest and driving a sharp knife to the middle of her sternum.  She states the pain was in 8-9 out of 10 at its worst.  She states she did have shortness of breath but no nausea or vomiting with the chest pain.  She states she has never had cardiac disease.  She denies any trauma to her chest or abdomen.  He reports that her blood pressure is normally well controlled, she reports she takes 3 medications for blood pressure.  States her diabetes has been well controlled with metformin.  She states her blood sugar was 90 this morning when she woke up but has not rechecked it since then.  Denies tobacco, alcohol, illicit drug use  ED Course: Blood pressure has remained elevated in the 149-177/89-101 range in the ER.  He had nitroglycerin paste applied and blood pressure pain improved.  She reports her chest pressure is now a 5/10 with no diaphoresis or shortness of breath.  EKG not show any acute ST elevation or depression.  Initial troponin was negative at 2.  CBC unremarkable.  BMP shows a sodium 140 potassium 3.6 chloride 109 bicarb 26 creatinine 0.76 BUN 10 glucose 131.  Chest x-ray shows no infiltrate or consolidation.  Hospitalist service  asked to elevate and manage patient overnight for further cardiac work-up  Review of Systems:  General: Denies fever, chills, weight loss, night sweats.  Denies dizziness.  Denies change in appetite HENT: Denies head trauma, headache, denies change in hearing, tinnitus.  Denies nasal congestion.  Denies sore throat.  Denies difficulty swallowing Eyes: Denies blurry vision, pain in eye, drainage.  Denies discoloration of eyes. Neck: Denies pain.  Denies swelling.  Denies pain with movement. Cardiovascular: Reports chest pain. Denies palpitations. Reports chronic edema.  Denies orthopnea Respiratory: Reports shortness of breath. Denies cough.  Denies wheezing.  Denies sputum production Gastrointestinal: Denies abdominal pain, swelling.  Denies nausea, vomiting, diarrhea.  Denies melena.  Denies hematemesis. Musculoskeletal: Denies limitation of movement.  Denies deformity or swelling. Denies arthralgias or myalgias. Genitourinary: Denies pelvic pain.  Denies urinary frequency or hesitancy.  Denies dysuria.  Skin: Denies rash.  Denies petechiae, purpura, ecchymosis. Neurological: Denies syncope. Denies seizure activity. Denies paresthesia. Denies slurred speech, drooping face.  Denies visual change. Psychiatric: Denies depression, anxiety. Denies hallucinations.  Past Medical History:  Diagnosis Date   Anemia    Brain tumor (benign) ([place])    has a tumor watching-no surgery=no tx    Eczema    GERD (gastroesophageal reflux disease)    Headache(784.0)    Heart murmur    dx as a child, no probems as adult   Hypertension    SVD (spontaneous vaginal delivery)  x 2    Past Surgical History:  Procedure Laterality Date   ABDOMINAL HYSTERECTOMY     LAPAROSCOPIC ASSISTED VAGINAL HYSTERECTOMY  08/04/2011   Procedure: LAPAROSCOPIC ASSISTED VAGINAL HYSTERECTOMY;  Surgeon: Melina Schools, MD;  Location: [facility];  Service: Gynecology;  Laterality: N/A;   MASS EXCISION  01/15/2012   Procedure:  EXCISION MASS;  Surgeon: Madilyn Hook, DO;  Location: [place];  Service: General;  Laterality: Right;  Excision of lower abdominal wall mass & excision  Right posterior upper leg mass   TUBAL LIGATION  2002   VENTRAL HERNIA REPAIR  01/15/2012   Procedure: HERNIA REPAIR VENTRAL ADULT;  Surgeon: Madilyn Hook, DO;  Location: [place];  Service: General;  Laterality: N/A;  Excision lower abdomen soft tissue mass (no hernia)   WISDOM TOOTH EXTRACTION      Social History  reports that she quit smoking about 26 years ago. Her smoking use included cigarettes. She has a 1.00 pack-year smoking history. She has never used smokeless tobacco. She reports current alcohol use. She reports that she does not use drugs.  Allergies  Allergen Reactions   Penicillins Hives, Other (See Comments) and Rash    Family History  Problem Relation Age of Onset   Cancer Maternal Aunt        Ovarian     Prior to Admission medications   Medication Sig Start Date End Date Taking? Authorizing Provider  amitriptyline (ELAVIL) 25 MG tablet  09/08/11   [provider]  aspirin 325 MG tablet Take 325 mg by mouth daily.    [provider]  esomeprazole (NEXIUM) 40 MG capsule Take 40 mg by mouth daily before breakfast.    [provider]  gabapentin (NEURONTIN) 300 MG capsule Take 300 mg by mouth 2 (two) times daily.    [provider]  metoprolol succinate (TOPROL-XL) 25 MG 24 hr tablet Take 25 mg by mouth daily.    [provider]  minocycline (MINOCIN,DYNACIN) 100 MG capsule Take 100 mg by mouth 2 (two) times daily.    [provider]  naproxen sodium (ANAPROX) 550 MG tablet  10/15/11   [provider]  zolmitriptan (ZOMIG) 5 MG tablet Take 5 mg by mouth daily as needed. For migraines    [provider]    Physical Exam: Vitals:   09/13/20 2038 09/13/20 2039 09/13/20 2100 09/13/20 2217  BP:  (!) 177/97 (!) 169/101  (!) 149/89  Pulse:  64 (!) 55 69  Resp:  '17 17 17  '$ Temp:  97.9 F (36.6 C)    TempSrc:  Oral    SpO2:  99% 100% 99%  Weight: 91.2 kg     Height: '5\' 7"'$  (1.702 m)       Constitutional: NAD, calm, comfortable Vitals:   09/13/20 2038 09/13/20 2039 09/13/20 2100 09/13/20 2217  BP:  (!) 177/97 (!) 169/101 (!) 149/89  Pulse:  64 (!) 55 69  Resp:  '17 17 17  '$ Temp:  97.9 F (36.6 C)    TempSrc:  Oral    SpO2:  99% 100% 99%  Weight: 91.2 kg     Height: '5\' 7"'$  (1.702 m)      General: WDWN, Alert and oriented x3.  Eyes: EOMI, PERRL, conjunctivae normal.  Sclera nonicteric HENT:  /AT, external ears normal.  Nares patent without epistasis.  Mucous membranes are moist. Posterior pharynx clear  Neck: Soft, normal range of motion, supple, no masses,  Trachea midline  Respiratory: clear to auscultation bilaterally, no wheezing, no crackles. Normal respiratory effort. No accessory muscle use.  Cardiovascular: Regular rate and rhythm, no murmurs / rubs / gallops. Mild lower extremity edema. 2+ pedal pulses.  Abdomen: Soft, no tenderness, nondistended, no rebound or guarding. No masses palpated. Obese. Bowel sounds normoactive Musculoskeletal: FROM. no cyanosis. No joint deformity upper and lower extremities. Normal muscle tone.  Skin: Warm, dry, intact no rashes, lesions, ulcers. No induration Neurologic: CN 2-12 grossly intact. Normal speech. Sensation intact to touch. Strength 5/5 in all extremities.   Psychiatric: Normal judgment and insight.  Normal mood.    Labs on Admission: I have personally reviewed following labs and imaging studies  CBC: Recent Labs  Lab 09/13/20 2117  WBC 6.9  NEUTROABS 2.1  HGB 12.7  HCT 39.2  MCV 73.4*  PLT [id number]    Basic Metabolic Panel: Recent Labs  Lab 09/13/20 2117  NA 140  K 3.6  CL 109  CO2 26  GLUCOSE 131*  BUN 10  CREATININE 0.76  CALCIUM 8.9    GFR: Estimated Creatinine Clearance: 91 mL/min (by C-G formula based on SCr of 0.76  mg/dL).  Liver Function Tests: No results for input(s): AST, ALT, ALKPHOS, BILITOT, PROT, ALBUMIN in the last 168 hours.  Urine analysis:    Component Value Date/Time   COLORURINE YELLOW 08/04/2011 0836   APPEARANCEUR CLEAR 08/04/2011 0836   LABSPEC >1.030 (H) 08/04/2011 0836   PHURINE 6.0 08/04/2011 0836   GLUCOSEU NEGATIVE 08/04/2011 0836   HGBUR NEGATIVE 08/04/2011 0836   BILIRUBINUR NEGATIVE 08/04/2011 0836   KETONESUR NEGATIVE 08/04/2011 0836   PROTEINUR NEGATIVE 08/04/2011 0836   UROBILINOGEN 0.2 08/04/2011 0836   NITRITE NEGATIVE 08/04/2011 0836   LEUKOCYTESUR NEGATIVE 08/04/2011 0836    Radiological Exams on Admission: DG Chest 2 View  Result Date: 09/13/2020 CLINICAL DATA:  Chest pain.  Hypertension and sternal chest pain. EXAM: CHEST - 2 VIEW COMPARISON:  None. FINDINGS: Heart size and pulmonary vascularity are normal. Lungs are clear. Soft tissue opacity in the right cardiophrenic angle likely represents pericardial cyst or lipoma. Vascular shadow also a possibility. Mediastinal contours are otherwise intact. No pleural effusions. No pneumothorax. IMPRESSION: No evidence of active pulmonary disease. Electronically Signed   By: Lucienne Capers M.D.   On: 09/13/2020 22:18    EKG: Independently reviewed.  EKG shows normal sinus rhythm with nonspecific ST changes.  No acute ST elevation or depression.  QTc 464 on first EKG and 487 on the repeat EKG  Assessment/Plan Principal Problem:   Chest pain Ms. Buol is placed on cardiac telemetry for observation for chest pain.  Obtain serial troponin levels.  If troponins remain negative we will proceed with stress test.  If troponins become positive will consult cardiology.  Antiplatelet therapy with aspirin daily.  Check lipid panel.  Continue statin therapy.  Monitor blood pressure.  Nitroglycerin paste applied in ER.  Supplemental oxygen as needed to maintain O2 sat between 92-96%  Active Problems:   Hypertensive urgency Pt  reports she takes 3 BP medications but only metoprolol on med list. Pharmacy to verify and reconcile medications.  Placed on ARB therapy with losartan with elevated BP. ARB will also provide renal protection with diabetes mellitus.  Monitor BP.     Diabetes mellitus type 2 in obese Continue Metformin. Check HgbA1c. Recheck glucose with labs in am    History of migraine headaches Hold triptan therapy with chest pain.     DVT prophylaxis: Lovenox for DVT  prophylaxis.   Code Status:   Full Code  Family Communication:  Diagnosis and plan discussed with patient.  She verbalized understanding agrees with plan.  Further recommendations to follow as clinical indicated Disposition Plan:   Patient is from:  Home  Anticipated DC to:  Home  Anticipated DC date:  Anticipate less than 2 midnight stay  Admission status:  Inpatient  Yevonne Aline Takeesha Isley MD [facility]  How to contact the [facility] Attending or Consulting provider [place] or covering provider during after hours [place], for this patient?   Check the care team in [facility] and look for a) attending/consulting [facility] provider listed and b) the [facility] team listed Log into [email or web address] and use [place] universal password to access. If you do not have the password, please contact the hospital operator. Locate the [facility] provider you are looking for under [facility] and page to a number that you can be directly reached. If you still have difficulty reaching the provider, please page the [facility] (Director on Call) for the Hospitalists listed on amion for assistance.  09/13/2020, 11:49 PM

## 2020-09-13 NOTE — ED Provider Notes (Signed)
[facility] DEPT Provider Note   CSN: [id number] Arrival date & time: 09/13/20  2027     History Chief Complaint  Patient presents with   Chest Pain    MAHDIYA [name] is a 55 y.o. female.  55 year old female with prior medical history as detailed below presents for evaluation.  Patient reports that she was getting a prescription filled this evening when she checked her blood pressure.  She saw that her blood pressure was elevated to [id number] systolic.  She was advised to come to the ED for evaluation by the pharmacist.  She developed chest discomfort when the pharmacist advised her to come to the ED for evaluation.  She describes anterior chest discomfort.  She denies associated shortness of breath, diaphoresis, nausea, vomiting, or other complaint.  She reports that her blood pressure is typically fairly well controlled.  She reports that she is compliant with her previously prescribed antihypertensives.  She denies prior history of CAD/ACS.  The history is provided by the patient.  Chest Pain Pain location:  Substernal area Pain quality: aching, pressure and sharp   Pain radiates to:  Does not radiate Pain severity:  Mild Onset quality:  Sudden Duration:  2 hours Timing:  Rare Progression:  Improving Chronicity:  New     Past Medical History:  Diagnosis Date   Anemia    Brain tumor (benign) (HCC)    has a tumor watching-no surgery=no tx    Eczema    GERD (gastroesophageal reflux disease)    Headache(784.0)    Heart murmur    dx as a child, no probems as adult   Hypertension    SVD (spontaneous vaginal delivery)    x 2    There are no problems to display for this patient.   Past Surgical History:  Procedure Laterality Date   ABDOMINAL HYSTERECTOMY     LAPAROSCOPIC ASSISTED VAGINAL HYSTERECTOMY  08/04/2011   Procedure: LAPAROSCOPIC ASSISTED VAGINAL HYSTERECTOMY;  Surgeon: Melina Schools, MD;  Location: [facility];  Service:  Gynecology;  Laterality: N/A;   MASS EXCISION  01/15/2012   Procedure: EXCISION MASS;  Surgeon: Madilyn Hook, DO;  Location: [facility];  Service: General;  Laterality: Right;  Excision of lower abdominal wall mass & excision  Right posterior upper leg mass   TUBAL LIGATION  2002   VENTRAL HERNIA REPAIR  01/15/2012   Procedure: HERNIA REPAIR VENTRAL ADULT;  Surgeon: Madilyn Hook, DO;  Location: [facility];  Service: General;  Laterality: N/A;  Excision lower abdomen soft tissue mass (no hernia)   WISDOM TOOTH EXTRACTION       OB History   No obstetric history on file.     Family History  Problem Relation Age of Onset   Cancer Maternal Aunt        Ovarian    Social History   Tobacco Use   Smoking status: Former    Packs/day: 0.25    Years: 4.00    Pack years: 1.00    Types: Cigarettes    Quit date: 01/20/1994    Years since quitting: 26.6   Smokeless tobacco: Never  Substance Use Topics   Alcohol use: Yes    Comment: socially - wine   Drug use: No    Home Medications Prior to Admission medications   Medication Sig Start Date End Date Taking? Authorizing Provider  amitriptyline (ELAVIL) 25 MG tablet  09/08/11   [provider]  aspirin 325 MG tablet  Take 325 mg by mouth daily.    [provider]  esomeprazole (NEXIUM) 40 MG capsule Take 40 mg by mouth daily before breakfast.    [provider]  gabapentin (NEURONTIN) 300 MG capsule Take 300 mg by mouth 2 (two) times daily.    [provider]  metoprolol succinate (TOPROL-XL) 25 MG 24 hr tablet Take 25 mg by mouth daily.    [provider]  minocycline (MINOCIN,DYNACIN) 100 MG capsule Take 100 mg by mouth 2 (two) times daily.    [provider]  naproxen sodium (ANAPROX) 550 MG tablet  10/15/11   [provider]  zolmitriptan (ZOMIG) 5 MG tablet Take 5 mg by mouth daily as needed. For migraines    [provider]     Allergies    Penicillins  Review of Systems   Review of Systems  Cardiovascular:  Positive for chest pain.  All other systems reviewed and are negative.  Physical Exam Updated Vital Signs BP (!) 149/89   Pulse 69   Temp 97.9 F (36.6 C) (Oral)   Resp 17   Ht '5\' 7"'$  (1.702 m)   Wt 91.2 kg   LMP 07/13/2011   SpO2 99%   BMI 31.48 kg/m   Physical Exam Vitals and nursing note reviewed.  Constitutional:      General: She is not in acute distress.    Appearance: Normal appearance. She is well-developed.  HENT:     Head: Normocephalic and atraumatic.  Eyes:     Conjunctiva/sclera: Conjunctivae normal.     Pupils: Pupils are equal, round, and reactive to light.  Cardiovascular:     Rate and Rhythm: Normal rate and regular rhythm.     Heart sounds: Normal heart sounds.  Pulmonary:     Effort: Pulmonary effort is normal. No respiratory distress.     Breath sounds: Normal breath sounds.  Abdominal:     General: There is no distension.     Palpations: Abdomen is soft.     Tenderness: There is no abdominal tenderness.  Musculoskeletal:        General: No deformity. Normal range of motion.     Cervical back: Normal range of motion and neck supple.  Skin:    General: Skin is warm and dry.  Neurological:     General: No focal deficit present.     Mental Status: She is alert and oriented to person, place, and time.    ED Results / Procedures / Treatments   Labs (all labs ordered are listed, but only abnormal results are displayed) Labs Reviewed  BASIC METABOLIC PANEL - Abnormal; Notable for the following components:      Result Value   Glucose, Bld 131 (*)    All other components within normal limits  CBC WITH DIFFERENTIAL/PLATELET - Abnormal; Notable for the following components:   RBC 5.34 (*)    MCV 73.4 (*)    MCH 23.8 (*)    RDW 16.4 (*)    nRBC 0.3 (*)    Lymphs Abs 4.2 (*)    All other components within normal limits  TROPONIN I (HIGH SENSITIVITY)   TROPONIN I (HIGH SENSITIVITY)    EKG EKG Interpretation  Date/Time:  Thursday September 13 2020 20:57:54 EDT Ventricular Rate:  62 PR Interval:  154 QRS Duration: 78 QT Interval:  458 QTC Calculation: 464 R Axis:   61 Text Interpretation: Normal sinus rhythm Nonspecific T wave abnormality Prolonged QT Abnormal ECG V4 lead missing Reconfirmed  by Dene Gentry [phone]) on 09/13/2020 11:14:25 PM  Radiology DG Chest 2 View  Result Date: 09/13/2020 CLINICAL DATA:  Chest pain.  Hypertension and sternal chest pain. EXAM: CHEST - 2 VIEW COMPARISON:  None. FINDINGS: Heart size and pulmonary vascularity are normal. Lungs are clear. Soft tissue opacity in the right cardiophrenic angle likely represents pericardial cyst or lipoma. Vascular shadow also a possibility. Mediastinal contours are otherwise intact. No pleural effusions. No pneumothorax. IMPRESSION: No evidence of active pulmonary disease. Electronically Signed   By: Lucienne Capers M.D.   On: 09/13/2020 22:18    Procedures Procedures   Medications Ordered in ED Medications  nitroGLYCERIN (NITROGLYN) 2 % ointment 1 inch (has no administration in time range)  acetaminophen (TYLENOL) tablet 1,000 mg (1,000 mg Oral Given 09/13/20 2120)    ED Course  I have reviewed the triage vital signs and the nursing notes.  Pertinent labs & imaging results that were available during my care of the patient were reviewed by me and considered in my medical decision making (see chart for details).    MDM Rules/Calculators/A&P                           MDM  MSE complete  CAIT REDDIG was evaluated in Emergency Department on 09/13/2020 for the symptoms described in the history of present illness. She was evaluated in the context of the global COVID-19 pandemic, which necessitated consideration that the patient might be at risk for infection with the SARS-CoV-2 virus that causes COVID-19. Institutional protocols and algorithms that pertain to the  evaluation of patients at risk for COVID-19 are in a state of rapid change based on information released by regulatory bodies including the CDC and federal and state organizations. These policies and algorithms were followed during the patient's care in the ED.   Patient is presenting for with reported chest pain.  Onset of symptoms associated with moderately elevated BP.  Patient without prior reported of ACS or CAD.  Initial EKG is without evidence of acute ischemia.  Initial troponin is 2.    Patient with risk factors for ACS including diabetes and hypertension.  Will plan for observation overnight and additional work-up.  Hospitalist service is aware of case and will evaluate for admission.    Final Clinical Impression(s) / ED Diagnoses Final diagnoses:  Chest pain, unspecified type    Rx / DC Orders ED Discharge Orders     None        Valarie Merino, MD 09/13/20 2315

## 2020-09-13 NOTE — ED Triage Notes (Addendum)
Pt arrives from her local pharmacy with c/o HTN and sternal cp. Pt admits cp began when pharmacist recommended coming to ER for hypertension. 186/112 pta.   Pt says she is compliant with her hctz and lisinopril.

## 2020-09-13 NOTE — ED Notes (Signed)
Pt. Documented in error see above note in chart. 

## 2020-09-14 ENCOUNTER — Observation Stay (HOSPITAL_BASED_OUTPATIENT_CLINIC_OR_DEPARTMENT_OTHER): Payer: Medicare Other

## 2020-09-14 ENCOUNTER — Ambulatory Visit (HOSPITAL_BASED_OUTPATIENT_CLINIC_OR_DEPARTMENT_OTHER)
Admit: 2020-09-14 | Discharge: 2020-09-14 | Disposition: A | Discharge status: Home / Self Care | Payer: Medicare Other | Attending: Family Medicine | Admitting: Family Medicine

## 2020-09-14 DIAGNOSIS — Z79899 Other long term (current) drug therapy: Secondary | ICD-10-CM | POA: Diagnosis not present

## 2020-09-14 DIAGNOSIS — E1169 Type 2 diabetes mellitus with other specified complication: Secondary | ICD-10-CM

## 2020-09-14 DIAGNOSIS — R079 Chest pain, unspecified: Secondary | ICD-10-CM

## 2020-09-14 DIAGNOSIS — I10 Essential (primary) hypertension: Secondary | ICD-10-CM | POA: Diagnosis not present

## 2020-09-14 DIAGNOSIS — Z8669 Personal history of other diseases of the nervous system and sense organs: Secondary | ICD-10-CM

## 2020-09-14 DIAGNOSIS — I160 Hypertensive urgency: Secondary | ICD-10-CM

## 2020-09-14 DIAGNOSIS — Z7982 Long term (current) use of aspirin: Secondary | ICD-10-CM | POA: Diagnosis not present

## 2020-09-14 DIAGNOSIS — Z87891 Personal history of nicotine dependence: Secondary | ICD-10-CM | POA: Diagnosis not present

## 2020-09-14 DIAGNOSIS — Z20822 Contact with and (suspected) exposure to covid-19: Secondary | ICD-10-CM | POA: Diagnosis not present

## 2020-09-14 DIAGNOSIS — E119 Type 2 diabetes mellitus without complications: Secondary | ICD-10-CM | POA: Diagnosis not present

## 2020-09-14 DIAGNOSIS — R0789 Other chest pain: Secondary | ICD-10-CM | POA: Diagnosis present

## 2020-09-14 DIAGNOSIS — E669 Obesity, unspecified: Secondary | ICD-10-CM

## 2020-09-14 LAB — LIPID PANEL
Cholesterol: 226 mg/dL — ABNORMAL HIGH (ref 0–200)
HDL: 55 mg/dL (ref >40)
LDL Cholesterol: 146 mg/dL — ABNORMAL HIGH (ref 0–99)
Total CHOL/HDL Ratio: 4.1 RATIO
Triglycerides: 126 mg/dL (ref <150)
VLDL: 25 mg/dL (ref 0–40)

## 2020-09-14 LAB — ECHOCARDIOGRAM COMPLETE
AR max vel: 2.19 cm2
AV Area VTI: 2.14 cm2
AV Area mean vel: 2.05 cm2
AV Mean grad: 5 mmHg
AV Peak grad: 8.8 mmHg
Ao pk vel: 1.48 m/s
Area-P 1/2: 3.02 cm2
Calc EF: 59.3 %
Height: 67 in
MV VTI: 2.26 cm2
S' Lateral: 1.85 cm
Single Plane A2C EF: 62 %
Single Plane A4C EF: 55.1 %
Weight: 3216 oz

## 2020-09-14 LAB — BASIC METABOLIC PANEL
Anion gap: 9 (ref 5–15)
BUN: 9 mg/dL (ref 6–20)
CO2: 27 mmol/L (ref 22–32)
Calcium: 9.3 mg/dL (ref 8.9–10.3)
Chloride: 108 mmol/L (ref 98–111)
Creatinine, Ser: 0.72 mg/dL (ref 0.44–1.00)
GFR, Estimated: >60 mL/min (ref >60)
Glucose, Bld: 111 mg/dL — ABNORMAL HIGH (ref 70–99)
Potassium: 3.6 mmol/L (ref 3.5–5.1)
Sodium: 144 mmol/L (ref 135–145)

## 2020-09-14 LAB — SARS CORONAVIRUS 2 (TAT 6-24 HRS): SARS Coronavirus 2: NEGATIVE

## 2020-09-14 LAB — CBC
HCT: 44.1 % (ref 36.0–46.0)
Hemoglobin: 14.2 g/dL (ref 12.0–15.0)
MCH: 23.7 pg — ABNORMAL LOW (ref 26.0–34.0)
MCHC: 32.2 g/dL (ref 30.0–36.0)
MCV: 73.7 fL — ABNORMAL LOW (ref 80.0–100.0)
Platelets: 372 10*3/uL (ref 150–400)
RBC: 5.98 MIL/uL — ABNORMAL HIGH (ref 3.87–5.11)
RDW: 17.1 % — ABNORMAL HIGH (ref 11.5–15.5)
WBC: 6.2 10*3/uL (ref 4.0–10.5)
nRBC: 0 % (ref 0.0–0.2)

## 2020-09-14 LAB — HEMOGLOBIN A1C
Hgb A1c MFr Bld: 6.4 % — ABNORMAL HIGH (ref 4.8–5.6)
Mean Plasma Glucose: 136.98 mg/dL

## 2020-09-14 LAB — NM MYOCAR MULTI W/SPECT W/WALL MOTION / EF
Base ST Depression (mm): 0 mm
Estimated workload: 1
Exercise duration (min): 0 min
Exercise duration (sec): 0 s
MPHR: 164 {beats}/min
Nuc Stress EF: 73 %
Peak HR: 107 {beats}/min
Percent HR: 65 %
RPE: 0
Rest HR: 62 {beats}/min
Rest Nuclear Isotope Dose: 10.3 mCi
ST Depression (mm): 0 mm
Stress Nuclear Isotope Dose: 30.3 mCi

## 2020-09-14 LAB — TROPONIN I (HIGH SENSITIVITY)
Troponin I (High Sensitivity): 3 ng/L (ref <18)
Troponin I (High Sensitivity): 3 ng/L (ref <18)

## 2020-09-14 LAB — HIV ANTIBODY (ROUTINE TESTING W REFLEX): HIV Screen 4th Generation wRfx: NONREACTIVE

## 2020-09-14 MED ORDER — NITROGLYCERIN 0.4 MG SL SUBL: 0.4000 mg | SUBLINGUAL_TABLET | SUBLINGUAL | Status: DC | PRN

## 2020-09-14 MED ORDER — METOPROLOL SUCCINATE ER 50 MG PO TB24
25.0000 mg | ORAL_TABLET | Freq: Every day | ORAL | Status: DC
  Filled 2020-09-14: qty 1

## 2020-09-14 MED ORDER — SODIUM CHLORIDE 0.9% FLUSH: 3.0000 mL | INTRAVENOUS | Status: DC | PRN

## 2020-09-14 MED ORDER — MUSCLE RUB 10-15 % EX CREA: TOPICAL_CREAM | CUTANEOUS | Status: DC | PRN

## 2020-09-14 MED ORDER — ACETAMINOPHEN 650 MG RE SUPP: 650.0000 mg | Freq: Four times a day (QID) | RECTAL | Status: DC | PRN

## 2020-09-14 MED ORDER — ATORVASTATIN CALCIUM 40 MG PO TABS: 40.0000 mg | ORAL_TABLET | Freq: Every day | ORAL | 0 refills | Status: DC

## 2020-09-14 MED ORDER — SODIUM CHLORIDE 0.9 % IV SOLN: 250.0000 mL | INTRAVENOUS | Status: DC | PRN

## 2020-09-14 MED ORDER — METFORMIN HCL 500 MG PO TABS
500.0000 mg | ORAL_TABLET | Freq: Two times a day (BID) | ORAL | Status: DC
  Filled 2020-09-14: qty 1

## 2020-09-14 MED ORDER — ATORVASTATIN CALCIUM 40 MG PO TABS: 40.0000 mg | ORAL_TABLET | Freq: Every day | ORAL | Status: DC

## 2020-09-14 MED ORDER — SODIUM CHLORIDE 0.9% FLUSH
3.0000 mL | Freq: Two times a day (BID) | INTRAVENOUS | Status: DC
  Administered 2020-09-14: 3 mL via INTRAVENOUS

## 2020-09-14 MED ORDER — REGADENOSON 0.4 MG/5ML IV SOLN
0.4000 mg | Freq: Once | INTRAVENOUS | Status: AC
  Administered 2020-09-14: 0.4 mg via INTRAVENOUS
  Filled 2020-09-14: qty 5

## 2020-09-14 MED ORDER — HYDRALAZINE HCL 20 MG/ML IJ SOLN
10.0000 mg | Freq: Four times a day (QID) | INTRAMUSCULAR | Status: DC | PRN
  Administered 2020-09-14: 10 mg via INTRAVENOUS
  Filled 2020-09-14: qty 1

## 2020-09-14 MED ORDER — TECHNETIUM TC 99M TETROFOSMIN IV KIT
10.3000 | PACK | Freq: Once | INTRAVENOUS | Status: AC | PRN
  Administered 2020-09-14: 10.3 via INTRAVENOUS

## 2020-09-14 MED ORDER — ACETAMINOPHEN 325 MG PO TABS: 650.0000 mg | ORAL_TABLET | Freq: Four times a day (QID) | ORAL | 0 refills | Status: DC | PRN

## 2020-09-14 MED ORDER — LOSARTAN POTASSIUM 25 MG PO TABS
100.0000 mg | ORAL_TABLET | Freq: Every day | ORAL | Status: DC
  Administered 2020-09-14: 100 mg via ORAL
  Filled 2020-09-14 (×2): qty 4

## 2020-09-14 MED ORDER — ACETAMINOPHEN 325 MG PO TABS
650.0000 mg | ORAL_TABLET | Freq: Four times a day (QID) | ORAL | Status: DC | PRN
  Administered 2020-09-14: 650 mg via ORAL
  Filled 2020-09-14: qty 2

## 2020-09-14 MED ORDER — SENNOSIDES-DOCUSATE SODIUM 8.6-50 MG PO TABS: 1.0000 | ORAL_TABLET | Freq: Every evening | ORAL | Status: DC | PRN

## 2020-09-14 MED ORDER — METOPROLOL TARTRATE 5 MG/5ML IV SOLN
INTRAVENOUS | Status: AC
  Filled 2020-09-14: qty 5

## 2020-09-14 MED ORDER — METOPROLOL TARTRATE 5 MG/5ML IV SOLN
2.5000 mg | Freq: Once | INTRAVENOUS | Status: AC
  Administered 2020-09-14: 2.5 mg via INTRAVENOUS

## 2020-09-14 MED ORDER — ENOXAPARIN SODIUM 40 MG/0.4ML IJ SOSY
40.0000 mg | PREFILLED_SYRINGE | INTRAMUSCULAR | Status: DC
  Filled 2020-09-14: qty 0.4

## 2020-09-14 MED ORDER — REGADENOSON 0.4 MG/5ML IV SOLN
INTRAVENOUS | Status: AC
  Filled 2020-09-14: qty 5

## 2020-09-14 MED ORDER — TECHNETIUM TC 99M TETROFOSMIN IV KIT
30.3000 | PACK | Freq: Once | INTRAVENOUS | Status: AC | PRN
  Administered 2020-09-14: 30.3 via INTRAVENOUS

## 2020-09-14 MED ORDER — HYDROCORTISONE 1 % EX CREA: TOPICAL_CREAM | Freq: Two times a day (BID) | CUTANEOUS | Status: DC

## 2020-09-14 MED ORDER — SENNOSIDES-DOCUSATE SODIUM 8.6-50 MG PO TABS: 1.0000 | ORAL_TABLET | Freq: Every evening | ORAL | 0 refills | Status: AC | PRN

## 2020-09-14 MED ORDER — LOSARTAN POTASSIUM 100 MG PO TABS: 100.0000 mg | ORAL_TABLET | Freq: Every day | ORAL | 0 refills | Status: DC

## 2020-09-14 MED ORDER — NITROGLYCERIN 0.4 MG SL SUBL: 0.4000 mg | SUBLINGUAL_TABLET | SUBLINGUAL | 12 refills | Status: AC | PRN

## 2020-09-14 MED ORDER — WHITE PETROLATUM EX OINT: TOPICAL_OINTMENT | CUTANEOUS | Status: DC | PRN

## 2020-09-14 NOTE — Discharge Summary (Signed)
Physician Discharge Summary  Ashley [name] [id number] DOB: 27-Jun-1965 DOA: 09/13/2020  PCP: Abram Sander, MD  Admit date: 09/13/2020 Discharge date: 09/14/2020  Admitted From: Home Disposition: Home  Recommendations for Outpatient Follow-up:  Follow up with PCP in 1-2 weeks Follow up with Cardiology within 1-2 weeks  Please obtain CMP/CBC, Mag, Phos in one week Please follow up on the following pending results:  Home Health: No Equipment/Devices: None    Discharge Condition: Stable CODE STATUS: FULL CODE Diet recommendation: Heart Healthy Carb Modified Diet  Brief/Interim Summary: The patient is a 55 year old obese African-American female with a past medical history significant for but not limited to hypertension, diabetes mellitus type 2, migraine headaches as well as other comorbidities who presented for the evaluation of chest pain and elevated blood pressure.  She is at the pharmacy yesterday afternoon and checked her blood pressure while they are waiting and is elevated with SBP of 185.  Our pharmacist had her sit and rest for a while and recheck blood pressures and is still elevated so they recommended her come to the ER for further evaluation.  She reports that she developed substernal chest pain and pressure while in the pharmacy and states that it felt as if somebody was standing on her chest and driving a sharp knife into the middle of her sternum.  She states that the pain was an 8 or 9 out of 10 at its worst.  She states that she did have some shortness of breath but no nausea or vomiting with the chest pain.  She states that she never had cardiac disease and denies any trauma to her chest or abdomen.  She reports that her blood pressure is normally well controlled if she takes the medication for blood pressure.  States her diabetes has been well controlled with metformin.  She denies any tobacco or illicit drug use or alcohol.  In the ED her blood pressure remained  elevated and and she had nitroglycerin paste applied and her blood pressure and her pain improved.  She reported chest pressure as a 5 out of 10 with no diaphoresis or shortness of breath.  EKG did not show any acute ST elevations or depressions and troponins were flat.  She was placed in observation for chest pain and underwent a nuclear med stress test which was read as low risk with normal perfusion and normal left ventricular regional and global systolic function and she also underwent an echocardiogram which showed some LVH and grade 1 diastolic dysfunction but an EF of 60 to 75%.  Right ventricular systolic function was normal.  There is no evidence of a mitral valve regurgitation and the aortic valve was grossly normal.  Her chest pain was improving and given her negative troponins low risk nuclear medicine stress test and echocardiogram findings she was deemed medically stable and will follow up with cardiology and PCP within 1 to 2 weeks.  The case was discussed with cardiology Dr. Virgina Jock who agreed with my assessment that this is likely not cardiac in nature and he recommended outpatient follow-up at his clinic.  Discharge Diagnoses:  Principal Problem:   Chest pain Active Problems:   Hypertensive urgency   Diabetes mellitus type 2 in obese (HCC)   History of migraine headaches  Chest pain rule out ACS -She is placed in cardiac telemetry for observation -Serial troponins were obtained and they were all flat negative -She underwent nuclear medicine stress testing which was read as low risk -She was  initiated on antiplatelet therapy with aspirin daily however given her low risk we will hold off -Lipid panel was checked and showed that she was hyperlipidemic and so statin was initiated -She is initiated on nitroglycerin paste in the ER which helped her blood pressure -She was given supplemental oxygen but did not require it at discharge -Echocardiogram done showed a normal EF of 65 to  70% -Case was discussed with cardiology and she will follow-up with Dr. Virgina Jock in outpatient setting for follow-up  Hypertensive urgency -She no longer takes metoprolol and takes atenolol and hydrochlorothiazide; will continue ARB -We will defer to cardiology for further blood pressure management and medication adjustments -Last blood pressure at discharge was 158/87  Hyperlipidemia -Initiated on statin at discharge  Diabetes mellitus type 2 -Hemoglobin A1c is relatively well controlled with a hemoglobin A1c of 6.4 -Continue home metformin at discharge  History of migraine headache -Resume home triptan at discharge  Obesity -Complicates overall prognosis and care -Estimated body mass index is 31.48 kg/m as calculated from the following:   Height as of this encounter: '5\' 7"'$  (1.702 m).   Weight as of this encounter: 91.2 kg. -Weight Loss and Dietary Counseling given   Discharge Instructions  Discharge Instructions     Call MD for:  difficulty breathing, headache or visual disturbances   Complete by: As directed    Call MD for:  extreme fatigue   Complete by: As directed    Call MD for:  hives   Complete by: As directed    Call MD for:  persistant dizziness or light-headedness   Complete by: As directed    Call MD for:  persistant nausea and vomiting   Complete by: As directed    Call MD for:  redness, tenderness, or signs of infection (pain, swelling, redness, odor or green/yellow discharge around incision site)   Complete by: As directed    Call MD for:  severe uncontrolled pain   Complete by: As directed    Call MD for:  temperature >100.4   Complete by: As directed    Diet - low sodium heart healthy   Complete by: As directed    Diet Carb Modified   Complete by: As directed    Discharge instructions   Complete by: As directed    You were cared for by a hospitalist during your hospital stay. If you have any questions about your discharge medications or the care  you received while you were in the hospital after you are discharged, you can call the unit and ask to speak with the hospitalist on call if the hospitalist that took care of you is not available. Once you are discharged, your primary care physician will handle any further medical issues. Please note that NO REFILLS for any discharge medications will be authorized once you are discharged, as it is imperative that you return to your primary care physician (or establish a relationship with a primary care physician if you do not have one) for your aftercare needs so that they can reassess your need for medications and monitor your lab values.  Follow up with PCP and Cardiology. Take all medications as prescribed. If symptoms change or worsen please return to the ED for evaluation   Increase activity slowly   Complete by: As directed       Allergies as of 09/14/2020       Reactions   Penicillins Hives, Other (See Comments), Rash        Medication  List     TAKE these medications    acetaminophen 325 MG tablet Commonly known as: TYLENOL Take 2 tablets (650 mg total) by mouth every 6 (six) hours as needed for mild pain (or Fever >/= 101).   atenolol 25 MG tablet Commonly known as: TENORMIN Take 25 mg by mouth at bedtime.   atorvastatin 40 MG tablet Commonly known as: LIPITOR Take 1 tablet (40 mg total) by mouth daily.   DULoxetine 20 MG capsule Commonly known as: CYMBALTA Take 20 mg by mouth daily.   esomeprazole 40 MG capsule Commonly known as: NEXIUM Take 40 mg by mouth daily before breakfast.   gabapentin 300 MG capsule Commonly known as: NEURONTIN Take 300 mg by mouth 2 (two) times daily.   hydrochlorothiazide 25 MG tablet Commonly known as: HYDRODIURIL Take 25 mg by mouth daily.   Levetiracetam 750 MG Tb24 Take 750 mg by mouth in the morning and at bedtime.   losartan 100 MG tablet Commonly known as: COZAAR Take 1 tablet (100 mg total) by mouth daily. Start taking  on: September 15, 2020   metFORMIN 500 MG 24 hr tablet Commonly known as: GLUCOPHAGE-XR Take 1,000 mg by mouth every morning.   nitroGLYCERIN 0.4 MG SL tablet Commonly known as: NITROSTAT Place 1 tablet (0.4 mg total) under the tongue every 5 (five) minutes as needed for chest pain.   senna-docusate 8.6-50 MG tablet Commonly known as: Senokot-S Take 1 tablet by mouth at bedtime as needed for mild constipation.   VITAMIN A PO Take 1 tablet by mouth daily.   VITAMIN B-12 PO Take 1 tablet by mouth daily.   VITAMIN C PO Take 1 tablet by mouth daily.   VITAMIN E PO Take 1 tablet by mouth daily.   zolmitriptan 5 MG tablet Commonly known as: ZOMIG Take 5 mg by mouth daily as needed for migraine.        Allergies  Allergen Reactions   Penicillins Hives, Other (See Comments) and Rash   Consultations: Discussed with Cardiology  Procedures/Studies: DG Chest 2 View  Result Date: 09/13/2020 CLINICAL DATA:  Chest pain.  Hypertension and sternal chest pain. EXAM: CHEST - 2 VIEW COMPARISON:  None. FINDINGS: Heart size and pulmonary vascularity are normal. Lungs are clear. Soft tissue opacity in the right cardiophrenic angle likely represents pericardial cyst or lipoma. Vascular shadow also a possibility. Mediastinal contours are otherwise intact. No pleural effusions. No pneumothorax. IMPRESSION: No evidence of active pulmonary disease. Electronically Signed   By: Lucienne Capers M.D.   On: 09/13/2020 22:18   NM Myocar Multi W/Spect W/Wall Motion / EF  Result Date: 09/14/2020   The study is normal. The study is low risk.   No ST deviation was noted.   Nuclear stress EF: 73 %. The left ventricular ejection fraction is hyperdynamic (>65%). Left ventricular function is normal. End diastolic cavity size is normal. Low risk stress nuclear study with normal perfusion and normal left ventricular regional and global systolic function.  ECHOCARDIOGRAM COMPLETE  Result Date: 09/14/2020     ECHOCARDIOGRAM REPORT   Patient Name:   ANGELIAH TABOR Date of Exam: 09/14/2020 Medical Rec #:  [id number]        Height:       67.0 in Accession #:    [id number]       Weight:       201.0 lb Date of Birth:  11/11/1965         BSA:  2.027 m Patient Age:    57 years         BP:           111/99 mmHg Patient Gender: F                HR:           68 bpm. Exam Location:  Inpatient Procedure: 2D Echo, Cardiac Doppler and Color Doppler                        STAT ECHO Reported to: Dr Cherlynn Kaiser on 09/14/2020 3:48:00 PM. Indications:    Chest pain  History:        Patient has no prior history of Echocardiogram examinations.  Sonographer:    Luisa Hart RDCS Referring Phys: [id number] Georgina Quint LATIF Chapman  1. Left ventricular ejection fraction, by estimation, is 65 to 70%. The left ventricle has normal function. The left ventricle has no regional wall motion abnormalities. There is mild left ventricular hypertrophy, with moderate asymmetric hypertrophy of  basal septum (14 mm). Left ventricular diastolic parameters are consistent with Grade I diastolic dysfunction (impaired relaxation).  2. Right ventricular systolic function is normal. The right ventricular size is normal. Tricuspid regurgitation signal is inadequate for assessing PA pressure.  3. The mitral valve is normal in structure. No evidence of mitral valve regurgitation. No evidence of mitral stenosis.  4. The aortic valve is grossly normal. Aortic valve regurgitation is not visualized. No aortic stenosis is present. FINDINGS  Left Ventricle: Left ventricular ejection fraction, by estimation, is 65 to 70%. The left ventricle has normal function. The left ventricle has no regional wall motion abnormalities. The left ventricular internal cavity size was normal in size. There is  mild left ventricular hypertrophy. Left ventricular diastolic parameters are consistent with Grade I diastolic dysfunction (impaired relaxation). Right Ventricle: The  right ventricular size is normal. No increase in right ventricular wall thickness. Right ventricular systolic function is normal. Tricuspid regurgitation signal is inadequate for assessing PA pressure. Left Atrium: Left atrial size was normal in size. Right Atrium: Right atrial size was normal in size. Pericardium: Trivial pericardial effusion is present. Mitral Valve: The mitral valve is normal in structure. No evidence of mitral valve regurgitation. No evidence of mitral valve stenosis. MV peak gradient, 4.7 mmHg. The mean mitral valve gradient is 1.0 mmHg. Tricuspid Valve: The tricuspid valve is normal in structure. Tricuspid valve regurgitation is trivial. No evidence of tricuspid stenosis. Aortic Valve: The aortic valve is grossly normal. Aortic valve regurgitation is not visualized. No aortic stenosis is present. Aortic valve mean gradient measures 5.0 mmHg. Aortic valve peak gradient measures 8.8 mmHg. Aortic valve area, by VTI measures 2.14 cm. Pulmonic Valve: The pulmonic valve was normal in structure. Pulmonic valve regurgitation is trivial. No evidence of pulmonic stenosis. Aorta: The aortic root is normal in size and structure. Venous: The inferior vena cava was not well visualized. IAS/Shunts: No atrial level shunt detected by color flow Doppler.  LEFT VENTRICLE PLAX 2D LVIDd:         2.70 cm     Diastology LVIDs:         1.85 cm     LV e' medial:    5.77 cm/s LV PW:         1.30 cm     LV E/e' medial:  10.1 LV IVS:        1.40 cm     LV  e' lateral:   9.25 cm/s LVOT diam:     2.00 cm     LV E/e' lateral: 6.3 LV SV:         53 LV SV Index:   26 LVOT Area:     3.14 cm  LV Volumes (MOD) LV vol d, MOD A2C: 26.1 ml LV vol d, MOD A4C: 47.0 ml LV vol s, MOD A2C: 9.9 ml LV vol s, MOD A4C: 21.1 ml LV SV MOD A2C:     16.2 ml LV SV MOD A4C:     47.0 ml LV SV MOD BP:      20.7 ml RIGHT VENTRICLE RV Basal diam:  3.20 cm RV Mid diam:    2.50 cm RV S prime:     8.27 cm/s TAPSE (M-mode): 1.7 cm LEFT ATRIUM              Index       RIGHT ATRIUM          Index LA diam:        3.60 cm 1.78 cm/m  RA Area:     8.27 cm LA Vol (A2C):   22.0 ml 10.86 ml/m RA Volume:   12.60 ml 6.22 ml/m LA Vol (A4C):   38.5 ml 19.00 ml/m LA Biplane Vol: 29.4 ml 14.51 ml/m  AORTIC VALVE                    PULMONIC VALVE AV Area (Vmax):    2.19 cm     PV Vmax:       1.46 m/s AV Area (Vmean):   2.05 cm     PV Vmean:      92.100 cm/s AV Area (VTI):     2.14 cm     PV VTI:        0.284 m AV Vmax:           148.00 cm/s  PV Peak grad:  8.5 mmHg AV Vmean:          102.000 cm/s PV Mean grad:  4.0 mmHg AV VTI:            0.249 m AV Peak Grad:      8.8 mmHg AV Mean Grad:      5.0 mmHg LVOT Vmax:         103.00 cm/s LVOT Vmean:        66.700 cm/s LVOT VTI:          0.170 m LVOT/AV VTI ratio: 0.68  AORTA Ao Root diam: 2.70 cm Ao Asc diam:  2.10 cm MITRAL VALVE MV Area (PHT): 3.02 cm     SHUNTS MV Area VTI:   2.26 cm     Systemic VTI:  0.17 m MV Peak grad:  4.7 mmHg     Systemic Diam: 2.00 cm MV Mean grad:  1.0 mmHg MV Vmax:       1.08 m/s MV Vmean:      54.6 cm/s MV Decel Time: 251 msec MV E velocity: 58.00 cm/s MV A velocity: 103.00 cm/s MV E/A ratio:  0.56 Cherlynn Kaiser MD Electronically signed by Cherlynn Kaiser MD Signature Date/Time: 09/14/2020/4:13:47 PM    Final     ECHOCARDIOGRAM IMPRESSIONS     1. Left ventricular ejection fraction, by estimation, is 65 to 70%. The  left ventricle has normal function. The left ventricle has no regional  wall motion abnormalities. There is mild left ventricular hypertrophy,  with moderate asymmetric hypertrophy of  basal septum (14 mm). Left ventricular diastolic parameters are  consistent with Grade I diastolic dysfunction (impaired relaxation).   2. Right ventricular systolic function is normal. The right ventricular  size is normal. Tricuspid regurgitation signal is inadequate for assessing  PA pressure.   3. The mitral valve is normal in structure. No evidence of mitral valve  regurgitation.  No evidence of mitral stenosis.   4. The aortic valve is grossly normal. Aortic valve regurgitation is not  visualized. No aortic stenosis is present.   FINDINGS   Left Ventricle: Left ventricular ejection fraction, by estimation, is 65  to 70%. The left ventricle has normal function. The left ventricle has no  regional wall motion abnormalities. The left ventricular internal cavity  size was normal in size. There is   mild left ventricular hypertrophy. Left ventricular diastolic parameters  are consistent with Grade I diastolic dysfunction (impaired relaxation).   Right Ventricle: The right ventricular size is normal. No increase in  right ventricular wall thickness. Right ventricular systolic function is  normal. Tricuspid regurgitation signal is inadequate for assessing PA  pressure.   Left Atrium: Left atrial size was normal in size.   Right Atrium: Right atrial size was normal in size.   Pericardium: Trivial pericardial effusion is present.   Mitral Valve: The mitral valve is normal in structure. No evidence of  mitral valve regurgitation. No evidence of mitral valve stenosis. MV peak  gradient, 4.7 mmHg. The mean mitral valve gradient is 1.0 mmHg.   Tricuspid Valve: The tricuspid valve is normal in structure. Tricuspid  valve regurgitation is trivial. No evidence of tricuspid stenosis.   Aortic Valve: The aortic valve is grossly normal. Aortic valve  regurgitation is not visualized. No aortic stenosis is present. Aortic  valve mean gradient measures 5.0 mmHg. Aortic valve peak gradient measures  8.8 mmHg. Aortic valve area, by VTI measures  2.14 cm.   Pulmonic Valve: The pulmonic valve was normal in structure. Pulmonic valve  regurgitation is trivial. No evidence of pulmonic stenosis.   Aorta: The aortic root is normal in size and structure.   Venous: The inferior vena cava was not well visualized.   IAS/Shunts: No atrial level shunt detected by color flow Doppler.       LEFT VENTRICLE  PLAX 2D  LVIDd:         2.70 cm     Diastology  LVIDs:         1.85 cm     LV e' medial:    5.77 cm/s  LV PW:         1.30 cm     LV E/e' medial:  10.1  LV IVS:        1.40 cm     LV e' lateral:   9.25 cm/s  LVOT diam:     2.00 cm     LV E/e' lateral: 6.3  LV SV:         53  LV SV Index:   26  LVOT Area:     3.14 cm     LV Volumes (MOD)  LV vol d, MOD A2C: 26.1 ml  LV vol d, MOD A4C: 47.0 ml  LV vol s, MOD A2C: 9.9 ml  LV vol s, MOD A4C: 21.1 ml  LV SV MOD A2C:     16.2 ml  LV SV MOD A4C:     47.0 ml  LV SV MOD BP:      20.7 ml  RIGHT VENTRICLE  RV Basal diam:  3.20 cm  RV Mid diam:    2.50 cm  RV S prime:     8.27 cm/s  TAPSE (M-mode): 1.7 cm   LEFT ATRIUM             Index       RIGHT ATRIUM          Index  LA diam:        3.60 cm 1.78 cm/m  RA Area:     8.27 cm  LA Vol (A2C):   22.0 ml 10.86 ml/m RA Volume:   12.60 ml 6.22 ml/m  LA Vol (A4C):   38.5 ml 19.00 ml/m  LA Biplane Vol: 29.4 ml 14.51 ml/m   AORTIC VALVE                    PULMONIC VALVE  AV Area (Vmax):    2.19 cm     PV Vmax:       1.46 m/s  AV Area (Vmean):   2.05 cm     PV Vmean:      92.100 cm/s  AV Area (VTI):     2.14 cm     PV VTI:        0.284 m  AV Vmax:           148.00 cm/s  PV Peak grad:  8.5 mmHg  AV Vmean:          102.000 cm/s PV Mean grad:  4.0 mmHg  AV VTI:            0.249 m  AV Peak Grad:      8.8 mmHg  AV Mean Grad:      5.0 mmHg  LVOT Vmax:         103.00 cm/s  LVOT Vmean:        66.700 cm/s  LVOT VTI:          0.170 m  LVOT/AV VTI ratio: 0.68     AORTA  Ao Root diam: 2.70 cm  Ao Asc diam:  2.10 cm   MITRAL VALVE  MV Area (PHT): 3.02 cm     SHUNTS  MV Area VTI:   2.26 cm     Systemic VTI:  0.17 m  MV Peak grad:  4.7 mmHg     Systemic Diam: 2.00 cm  MV Mean grad:  1.0 mmHg  MV Vmax:       1.08 m/s  MV Vmean:      54.6 cm/s  MV Decel Time: 251 msec  MV E velocity: 58.00 cm/s  MV A velocity: 103.00 cm/s  MV E/A ratio:  0.56   NM STRESS  TEST      The study is normal. The study is low risk.   No ST deviation was noted.   Nuclear stress EF: 73 %. The left ventricular ejection fraction is hyperdynamic (>65%). Left ventricular function is normal. End diastolic cavity size is normal.   Low risk stress nuclear study with normal perfusion and normal left ventricular regional and global systolic function  Subjective: Seen and examined at bedside and her midsternal chest pain was not alleviated by nitroglycerin and likely not cardiac in nature.  Her blood pressure was improved with the nitroglycerin however.  She denies any nausea or vomiting.  Admitted to having some cramping in her left fifth digit but no numbness or tingling.  No shortness of breath currently.  No other concerns  or plans at this time and has improved.  She will follow-up with cardiology in outpatient setting and she understands agrees with the plan of care.  Discharge Exam: Vitals:   09/14/20 1530 09/14/20 1630  BP: 126/87 (!) 158/87  Pulse: 65 77  Resp: 18 18  Temp:    SpO2: 96% 97%   Vitals:   09/14/20 1420 09/14/20 1430 09/14/20 1530 09/14/20 1630  BP: (!) 152/98 (!) 145/75 126/87 (!) 158/87  Pulse: 67 88 65 77  Resp:  '19 18 18  '$ Temp:  98.2 F (36.8 C)    TempSrc:  Oral    SpO2: 99% 98% 96% 97%  Weight:      Height:       General: Pt is alert, awake, not in acute distress Cardiovascular: RRR, S1/S2 +, no rubs, no gallops Respiratory: Diminished bilaterally, no wheezing, no rhonchi; unlabored breathing and not wearing supplemental oxygen via nasal cannula Abdominal: Soft, NT, distended secondary body habitus, bowel sounds + Extremities: no edema, no cyanosis  The results of significant diagnostics from this hospitalization (including imaging, microbiology, ancillary and laboratory) are listed below for reference.    Microbiology: Recent Results (from the past 240 hour(s))  SARS CORONAVIRUS 2 (TAT 6-24 HRS) Nasopharyngeal Nasopharyngeal Swab      Status: None   Collection Time: 09/14/20  1:59 AM   Specimen: Nasopharyngeal Swab  Result Value Ref Range Status   SARS Coronavirus 2 NEGATIVE NEGATIVE Final    Comment: (NOTE) SARS-CoV-2 target nucleic acids are NOT DETECTED.  The SARS-CoV-2 RNA is generally detectable in upper and lower respiratory specimens during the acute phase of infection. Negative results do not preclude SARS-CoV-2 infection, do not rule out co-infections with other pathogens, and should not be used as the sole basis for treatment or other patient management decisions. Negative results must be combined with clinical observations, patient history, and epidemiological information. The expected result is Negative.  Fact Sheet for Patients: SugarRoll.be  Fact Sheet for Healthcare Providers: https://www.woods-mathews.com/  This test is not yet approved or cleared by the [place] FDA and  has been authorized for detection and/or diagnosis of SARS-CoV-2 by FDA under an Emergency Use Authorization (EUA). This EUA will remain  in effect (meaning this test can be used) for the duration of the COVID-19 declaration under Se ction 564(b)(1) of the Act, 21 U.S.C. section 360bbb-3(b)(1), unless the authorization is terminated or revoked sooner.  Performed at [facility], [place] [address] [place], [place] [address]     Labs: BNP (last 3 results) No results for input(s): BNP in the last 8760 hours. Basic Metabolic Panel: Recent Labs  Lab 09/13/20 2117 09/14/20 0500  NA 140 144  K 3.6 3.6  CL 109 108  CO2 26 27  GLUCOSE 131* 111*  BUN 10 9  CREATININE 0.76 0.72  CALCIUM 8.9 9.3   Liver Function Tests: No results for input(s): AST, ALT, ALKPHOS, BILITOT, PROT, ALBUMIN in the last 168 hours. No results for input(s): LIPASE, AMYLASE in the last 168 hours. No results for input(s): AMMONIA in the last 168 hours. CBC: Recent Labs  Lab 09/13/20 2117  09/14/20 0500  WBC 6.9 6.2  NEUTROABS 2.1  --   HGB 12.7 14.2  HCT 39.2 44.1  MCV 73.4* 73.7*  PLT 322 372   Cardiac Enzymes: No results for input(s): CKTOTAL, CKMB, CKMBINDEX, TROPONINI in the last 168 hours. BNP: Invalid input(s): POCBNP CBG: No results for input(s): GLUCAP in the last 168 hours.  D-Dimer No results for input(s): DDIMER in the last 72 hours. Hgb A1c Recent Labs    09/14/20 0040  HGBA1C 6.4*   Lipid Profile Recent Labs    09/14/20 1306  CHOL 226*  HDL 55  LDLCALC 146*  TRIG 126  CHOLHDL 4.1   Thyroid function studies No results for input(s): TSH, T4TOTAL, T3FREE, THYROIDAB in the last 72 hours.  Invalid input(s): FREET3 Anemia work up No results for input(s): VITAMINB12, FOLATE, FERRITIN, TIBC, IRON, RETICCTPCT in the last 72 hours. Urinalysis    Component Value Date/Time   COLORURINE YELLOW 08/04/2011 0836   APPEARANCEUR CLEAR 08/04/2011 0836   LABSPEC >1.030 (H) 08/04/2011 0836   PHURINE 6.0 08/04/2011 0836   GLUCOSEU NEGATIVE 08/04/2011 0836   HGBUR NEGATIVE 08/04/2011 0836   BILIRUBINUR NEGATIVE 08/04/2011 0836   KETONESUR NEGATIVE 08/04/2011 0836   PROTEINUR NEGATIVE 08/04/2011 0836   UROBILINOGEN 0.2 08/04/2011 0836   NITRITE NEGATIVE 08/04/2011 0836   LEUKOCYTESUR NEGATIVE 08/04/2011 0836   Sepsis Labs Invalid input(s): PROCALCITONIN,  WBC,  LACTICIDVEN Microbiology Recent Results (from the past 240 hour(s))  SARS CORONAVIRUS 2 (TAT 6-24 HRS) Nasopharyngeal Nasopharyngeal Swab     Status: None   Collection Time: 09/14/20  1:59 AM   Specimen: Nasopharyngeal Swab  Result Value Ref Range Status   SARS Coronavirus 2 NEGATIVE NEGATIVE Final    Comment: (NOTE) SARS-CoV-2 target nucleic acids are NOT DETECTED.  The SARS-CoV-2 RNA is generally detectable in upper and lower respiratory specimens during the acute phase of infection. Negative results do not preclude SARS-CoV-2 infection, do not rule out co-infections with other  pathogens, and should not be used as the sole basis for treatment or other patient management decisions. Negative results must be combined with clinical observations, patient history, and epidemiological information. The expected result is Negative.  Fact Sheet for Patients: SugarRoll.be  Fact Sheet for Healthcare Providers: https://www.woods-mathews.com/  This test is not yet approved or cleared by the [place] FDA and  has been authorized for detection and/or diagnosis of SARS-CoV-2 by FDA under an Emergency Use Authorization (EUA). This EUA will remain  in effect (meaning this test can be used) for the duration of the COVID-19 declaration under Se ction 564(b)(1) of the Act, 21 U.S.C. section 360bbb-3(b)(1), unless the authorization is terminated or revoked sooner.  Performed at [facility], [place] [address] [place], [place] [address]     Time coordinating discharge: 35 minutes  SIGNED:  Kerney Elbe, DO [facility] 09/14/2020, 6:54 PM Pager is on [place]  If 7PM-7AM, please contact night-coverage [email or web address]

## 2020-09-14 NOTE — Progress Notes (Signed)
*  PRELIMINARY RESULTS* Echocardiogram 2D Echocardiogram has been performed.  Luisa Hart RDCS 09/14/2020, 3:47 PM

## 2020-09-14 NOTE — ED Notes (Signed)
D/c instructions reviewed with patient. Medications reviewed, and pt verbalized understanding of following up with cardiology. Patient denies questions about d/c.

## 2020-09-14 NOTE — ED Notes (Signed)
[facility] called for transport needs [facility] for stress test

## 2020-09-14 NOTE — Progress Notes (Signed)
Pt at [facility] for lexiscan, stress portion completed without complications except elevated BP to 220/120 at end of study she rec'd lopressor 2.5 mg IV since it would be over an hour before return to [facility] for her meds.    Tests results will follow this afternoon.

## 2020-09-27 ENCOUNTER — Encounter (HOSPITAL_COMMUNITY): Payer: Self-pay | Admitting: Physician Assistant

## 2020-09-27 ENCOUNTER — Emergency Department (HOSPITAL_COMMUNITY)
Admission: EM | Admit: 2020-09-27 | Discharge: 2020-09-28 | Disposition: A | Discharge status: Home / Self Care | Payer: Medicare Other | Attending: Emergency Medicine | Admitting: Emergency Medicine

## 2020-09-27 ENCOUNTER — Emergency Department (HOSPITAL_COMMUNITY): Payer: Medicare Other

## 2020-09-27 ENCOUNTER — Other Ambulatory Visit: Payer: Self-pay

## 2020-09-27 DIAGNOSIS — Z87891 Personal history of nicotine dependence: Secondary | ICD-10-CM | POA: Insufficient documentation

## 2020-09-27 DIAGNOSIS — M545 Low back pain, unspecified: Secondary | ICD-10-CM | POA: Insufficient documentation

## 2020-09-27 DIAGNOSIS — X500XXA Overexertion from strenuous movement or load, initial encounter: Secondary | ICD-10-CM | POA: Insufficient documentation

## 2020-09-27 DIAGNOSIS — I10 Essential (primary) hypertension: Secondary | ICD-10-CM | POA: Diagnosis not present

## 2020-09-27 DIAGNOSIS — E119 Type 2 diabetes mellitus without complications: Secondary | ICD-10-CM | POA: Diagnosis not present

## 2020-09-27 DIAGNOSIS — Z79899 Other long term (current) drug therapy: Secondary | ICD-10-CM | POA: Insufficient documentation

## 2020-09-27 DIAGNOSIS — S3992XA Unspecified injury of lower back, initial encounter: Secondary | ICD-10-CM | POA: Diagnosis present

## 2020-09-27 DIAGNOSIS — Z7984 Long term (current) use of oral hypoglycemic drugs: Secondary | ICD-10-CM | POA: Insufficient documentation

## 2020-09-27 DIAGNOSIS — S46812A Strain of other muscles, fascia and tendons at shoulder and upper arm level, left arm, initial encounter: Secondary | ICD-10-CM

## 2020-09-27 DIAGNOSIS — M859 Disorder of bone density and structure, unspecified: Secondary | ICD-10-CM | POA: Diagnosis not present

## 2020-09-27 DIAGNOSIS — M542 Cervicalgia: Secondary | ICD-10-CM | POA: Diagnosis not present

## 2020-09-27 DIAGNOSIS — S46811A Strain of other muscles, fascia and tendons at shoulder and upper arm level, right arm, initial encounter: Secondary | ICD-10-CM | POA: Insufficient documentation

## 2020-09-27 MED ORDER — METHOCARBAMOL 500 MG PO TABS
1000.0000 mg | ORAL_TABLET | Freq: Once | ORAL | Status: AC
  Administered 2020-09-27: 1000 mg via ORAL
  Filled 2020-09-27: qty 2

## 2020-09-27 MED ORDER — KETOROLAC TROMETHAMINE 60 MG/2ML IM SOLN
60.0000 mg | Freq: Once | INTRAMUSCULAR | Status: AC
  Administered 2020-09-27: 60 mg via INTRAMUSCULAR
  Filled 2020-09-27: qty 2

## 2020-09-27 NOTE — ED Provider Notes (Signed)
[facility] DEPT Provider Note   CSN: [id number] Arrival date & time: 09/27/20  2206     History Chief Complaint  Patient presents with   Back Pain    Ashley [name] is a 55 y.o. 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  Reports around 4 PM she took baclofen but this has not really helped her pain.  Reports low back pain is midline does not radiate.  She did not hear a pop or crack.  No falls or new trauma.  Denies history of cancer, IV drug use, fevers, chills.  Patient reports chronic back pain but today is worse.  Also reports left-sided neck pain after lifting boxes.  Movement and palpation make the symptoms worse.  Nothing really seems to make them better.  The history is provided by the patient and medical records. No language interpreter was used.      Past Medical History:  Diagnosis Date   Anemia    Brain tumor (benign) ([place])    has a tumor watching-no surgery=no tx    Eczema    GERD (gastroesophageal reflux disease)    Headache(784.0)    Heart murmur    dx as a child, no probems as adult   Hypertension    SVD (spontaneous vaginal delivery)    x 2    Patient Active Problem List   Diagnosis Date Noted   Chest pain 09/13/2020   Hypertensive urgency 09/13/2020   Diabetes mellitus type 2 in obese ([place]) 09/13/2020   History of migraine headaches 09/13/2020    Past Surgical History:  Procedure Laterality Date   ABDOMINAL HYSTERECTOMY     LAPAROSCOPIC ASSISTED VAGINAL HYSTERECTOMY  08/04/2011   Procedure: LAPAROSCOPIC ASSISTED VAGINAL HYSTERECTOMY;  Surgeon: Melina Schools, MD;  Location: [facility];  Service: Gynecology;  Laterality: N/A;   MASS EXCISION  01/15/2012   Procedure: EXCISION MASS;  Surgeon: Madilyn Hook, DO;  Location: [facility];  Service: General;  Laterality: Right;  Excision of lower abdominal wall  mass & excision  Right posterior upper leg mass   TUBAL LIGATION  2002   VENTRAL HERNIA REPAIR  01/15/2012   Procedure: HERNIA REPAIR VENTRAL ADULT;  Surgeon: Madilyn Hook, DO;  Location: [facility];  Service: General;  Laterality: N/A;  Excision lower abdomen soft tissue mass (no hernia)   WISDOM TOOTH EXTRACTION       OB History   No obstetric history on file.     Family History  Problem Relation Age of Onset   Cancer Maternal Aunt        Ovarian    Social History   Tobacco Use   Smoking status: Former    Packs/day: 0.25    Years: 4.00    Pack years: 1.00    Types: Cigarettes    Quit date: 01/20/1994    Years since quitting: 26.7   Smokeless tobacco: Never  Substance Use Topics   Alcohol use: Yes    Comment: socially - wine   Drug use: No    Home Medications Prior to Admission medications   Medication Sig Start Date End Date Taking? Authorizing Provider  meloxicam (MOBIC) 7.5 MG tablet Take 1 tablet (7.5 mg total) by mouth daily. 09/28/20  Yes Anja Neuzil, Jarrett Soho, PA-C  methocarbamol (ROBAXIN) 500 MG tablet Take 1 tablet (500 mg total) by mouth 2 (two) times daily. 09/28/20  Yes Onia Shiflett, Jarrett Soho, PA-C  acetaminophen (TYLENOL) 325 MG tablet Take 2 tablets (650 mg total) by mouth every 6 (six) hours as needed for mild pain (or Fever >/= 101). 09/14/20   Sheikh, Omair Latif, DO  Ascorbic Acid (VITAMIN C PO) Take 1 tablet by mouth daily.    [provider]  atenolol (TENORMIN) 25 MG tablet Take 25 mg by mouth at bedtime. 07/05/20   [provider]  atorvastatin (LIPITOR) 40 MG tablet Take 1 tablet (40 mg total) by mouth daily. 09/14/20   Raiford Noble Latif, DO  Cyanocobalamin (VITAMIN B-12 PO) Take 1 tablet by mouth daily.    [provider]  DULoxetine (CYMBALTA) 20 MG capsule Take 20 mg by mouth daily.    [provider]  esomeprazole (NEXIUM) 40 MG capsule Take 40 mg by mouth daily before breakfast.    [provider]  gabapentin (NEURONTIN) 300 MG capsule Take 300 mg by mouth 2 (two) times daily.    [provider]  hydrochlorothiazide (HYDRODIURIL) 25 MG tablet Take 25 mg by mouth daily. 09/11/20   [provider]  Levetiracetam 750 MG TB24 Take 750 mg by mouth in the morning and at bedtime. 09/06/20   [provider]  losartan (COZAAR) 100 MG tablet Take 1 tablet (100 mg total) by mouth daily. 09/15/20 10/15/20  Raiford Noble Latif, DO  metFORMIN (GLUCOPHAGE-XR) 500 MG 24 hr tablet Take 1,000 mg by mouth every morning. 06/18/20   [provider]  nitroGLYCERIN (NITROSTAT) 0.4 MG SL tablet Place 1 tablet (0.4 mg total) under the tongue every 5 (five) minutes as needed for chest pain. 09/14/20   Sheikh, Omair Latif, DO  senna-docusate (SENOKOT-S) 8.6-50 MG tablet Take 1 tablet by mouth at bedtime as needed for mild constipation. 09/14/20   Raiford Noble Latif, DO  VITAMIN A PO Take 1 tablet by mouth daily.    [provider]  VITAMIN E PO Take 1 tablet by mouth daily.    [provider]  zolmitriptan (ZOMIG) 5 MG tablet Take 5 mg by mouth daily as needed for migraine.    [provider]    Allergies    Penicillins  Review of Systems   Review of Systems  Constitutional:  Negative for appetite change, diaphoresis, fatigue, fever and unexpected weight change.  HENT:  Negative for mouth sores.   Eyes:  Negative for visual disturbance.  Respiratory:  Negative for cough, chest tightness, shortness of breath and wheezing.   Cardiovascular:  Negative for chest pain.  Gastrointestinal:  Negative for abdominal pain, constipation, diarrhea, nausea and vomiting.  Endocrine: Negative for polydipsia, polyphagia and polyuria.  Genitourinary:  Negative for dysuria, frequency, hematuria and urgency.  Musculoskeletal:  Positive for back pain and neck pain. Negative for gait problem and neck stiffness.  Skin:  Negative for rash.   Allergic/Immunologic: Negative for immunocompromised state.  Neurological:  Negative for syncope, weakness, light-headedness, numbness and headaches.  Hematological:  Does not bruise/bleed easily.  Psychiatric/Behavioral:  Negative for sleep disturbance. The patient is not nervous/anxious.    Physical Exam Updated Vital Signs BP (!) 150/77 (BP Location: Left Arm)   Pulse 74   Temp 98.3 F (36.8 C) (Oral)   Resp 15   Ht '5\' 7"'$  (1.702 m)   Wt 90.7 kg   LMP 07/13/2011   SpO2 100%   BMI 31.32 kg/m   Physical Exam Vitals and nursing note reviewed.  Constitutional:      General: She is  not in acute distress.    Appearance: She is well-developed. She is not diaphoretic.  HENT:     Head: Normocephalic and atraumatic.     Mouth/Throat:     Pharynx: No oropharyngeal exudate.  Eyes:     Conjunctiva/sclera: Conjunctivae normal.  Neck:     Comments: Full ROM without pain Cardiovascular:     Rate and Rhythm: Normal rate and regular rhythm.  Pulmonary:     Effort: Pulmonary effort is normal. No respiratory distress.     Breath sounds: Normal breath sounds. No wheezing.  Abdominal:     General: There is no distension.     Palpations: Abdomen is soft.     Tenderness: There is no abdominal tenderness.  Musculoskeletal:        General: Normal range of motion.     Cervical back: Normal range of motion and neck supple. Tenderness present.     Thoracic back: Normal.     Lumbar back: Tenderness and bony tenderness present. No swelling. Normal range of motion.       Back:     Comments: Full range of motion of the T-spine and L-spine Mild midline tenderness of the L-spine; no step off or deformity   Lymphadenopathy:     Cervical: No cervical adenopathy.  Skin:    General: Skin is warm and dry.     Findings: No erythema or rash.  Neurological:     Mental Status: She is alert.     Comments: Speech is clear and goal oriented, follows commands Normal 5/5 strength in upper and lower  extremities bilaterally including dorsiflexion and plantar flexion, strong and equal grip strength Sensation normal to light and sharp touch Moves extremities without ataxia, coordination intact Normal gait Normal balance No Clonus  Psychiatric:        Behavior: Behavior normal.    ED Results / Procedures / Treatments     Radiology DG Lumbar Spine Complete  Result Date: 09/28/2020 CLINICAL DATA:  Low back pain EXAM: LUMBAR SPINE - COMPLETE 4+ VIEW COMPARISON:  None. FINDINGS: Normal lumbar lordosis. There is grade 1 anterolisthesis of L4 upon L5 with superimposed intervertebral disc space narrowing in keeping with moderate to severe degenerative disc disease. Vertebral body heights are preserved; no fracture of the lumbar spine. Remaining intervertebral disc heights are preserved. Oblique views demonstrate no evidence of pars defect. The paraspinal soft tissues are unremarkable. IMPRESSION: Advanced degenerative disc disease L4-5 with associated grade 1 anterolisthesis. No acute fracture of the lumbar spine. Electronically Signed   By: Fidela Salisbury M.D.   On: 09/28/2020 00:06    Procedures Procedures   Medications Ordered in ED Medications  ketorolac (TORADOL) injection 60 mg (60 mg Intramuscular Given 09/27/20 2305)  methocarbamol (ROBAXIN) tablet 1,000 mg (1,000 mg Oral Given 09/27/20 2305)    ED Course  I have reviewed the triage vital signs and the nursing notes.  Pertinent labs & imaging results that were available during my care of the patient were reviewed by me and considered in my medical decision making (see chart for details).    MDM Rules/Calculators/A&P                           Patient with back pain after lifting boxes.  Midline on exam without step-off or deformity.  Ambulatory without difficulty.  No numbness or weakness in the lower extremities.  No saddle anesthesia, loss of bowel or bladder control.  Neck pain  consistent with trapezius strain and spasm.  No  midline cervical pain.  Plain films of the L-spine pending.  Toradol and Robaxin given.  1:59 AM Plain films with chronic changes.  No evidence of acute fracture or dislocation.  I personally evaluated these images.  Patient reports he is feeling significantly better after medications.  I have requested that she stop taking the baclofen and will start her on Robaxin.  We will also give a short course of Mobic.  She is to take this with food.  She is to follow with primary care and orthopedics for further evaluation and treatment of her ongoing back pain.  Patient states understanding and is in agreement with the plan.   Final Clinical Impression(s) / ED Diagnoses Final diagnoses:  Midline low back pain without sciatica, unspecified chronicity  Trapezius strain, left, initial encounter    Rx / DC Orders ED Discharge Orders          Ordered    meloxicam (MOBIC) 7.5 MG tablet  Daily        09/28/20 0158    methocarbamol (ROBAXIN) 500 MG tablet  2 times daily        09/28/20 0158             Bernis Schreur, Jarrett Soho, PA-C 09/28/20 0200    Fatima Blank, MD 09/29/20 [phone]

## 2020-09-27 NOTE — ED Triage Notes (Signed)
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

## 2020-09-28 MED ORDER — MELOXICAM 7.5 MG PO TABS: 7.5000 mg | ORAL_TABLET | Freq: Every day | ORAL | 0 refills | Status: DC

## 2020-09-28 MED ORDER — METHOCARBAMOL 500 MG PO TABS: 500.0000 mg | ORAL_TABLET | Freq: Two times a day (BID) | ORAL | 0 refills | Status: DC

## 2020-09-28 NOTE — Discharge Instructions (Addendum)
1. Medications: Stop baclofen; start robaxin, Mobic - with food, usual home medications 2. Treatment: rest, drink plenty of fluids, gentle stretching as discussed, alternate ice and heat 3. Follow Up: Please followup with your primary doctor in 3 days for discussion of your diagnoses and further evaluation after today's visit; if you do not have a primary care doctor use the resource guide provided to find one;  Return to the ER for worsening back pain, difficulty walking, loss of bowel or bladder control or other concerning symptoms

## 2021-09-19 ENCOUNTER — Encounter: Payer: Self-pay | Admitting: Emergency Medicine

## 2021-09-19 ENCOUNTER — Ambulatory Visit (HOSPITAL_BASED_OUTPATIENT_CLINIC_OR_DEPARTMENT_OTHER)
Admission: RE | Admit: 2021-09-19 | Discharge: 2021-09-19 | Disposition: A | Discharge status: Home / Self Care | Payer: Medicare Other | Source: Ambulatory Visit | Attending: Emergency Medicine | Admitting: Emergency Medicine

## 2021-09-19 ENCOUNTER — Ambulatory Visit
Admission: EM | Admit: 2021-09-19 | Discharge: 2021-09-19 | Disposition: A | Discharge status: Home / Self Care | Payer: Medicare Other | Attending: Emergency Medicine | Admitting: Emergency Medicine

## 2021-09-19 DIAGNOSIS — M542 Cervicalgia: Secondary | ICD-10-CM

## 2021-09-19 DIAGNOSIS — G8929 Other chronic pain: Secondary | ICD-10-CM

## 2021-09-19 DIAGNOSIS — M5137 Other intervertebral disc degeneration, lumbosacral region: Secondary | ICD-10-CM | POA: Diagnosis not present

## 2021-09-19 DIAGNOSIS — M545 Low back pain, unspecified: Secondary | ICD-10-CM

## 2021-09-19 MED ORDER — BACLOFEN 10 MG PO TABS: 10.0000 mg | ORAL_TABLET | Freq: Three times a day (TID) | ORAL | 0 refills | Status: AC

## 2021-09-19 MED ORDER — DICLOFENAC SODIUM 1 % EX GEL: 4.0000 g | Freq: Four times a day (QID) | CUTANEOUS | 2 refills | Status: DC

## 2021-09-19 MED ORDER — ACETAMINOPHEN 500 MG PO TABS: 1000.0000 mg | ORAL_TABLET | Freq: Three times a day (TID) | ORAL | 0 refills | Status: AC

## 2021-09-19 MED ORDER — KETOROLAC TROMETHAMINE 30 MG/ML IJ SOLN
30.0000 mg | Freq: Once | INTRAMUSCULAR | Status: AC
  Administered 2021-09-19: 30 mg via INTRAMUSCULAR
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
## 2021-09-19 NOTE — Discharge Instructions (Addendum)
Please go to [facility] to have your x-rays done.  Once I receive the report from the radiologist, which typically takes about an hour, I will reach out to you with any further recommendations.  The mainstay of therapy for musculoskeletal pain is reduction of inflammation and relaxation of tension which is causing inflammation.  Keep in mind, pain always begets more pain.  To help you stay ahead of your pain and inflammation, I have provided the following regimen for you:   During your visit today, you received an injection of ketorolac, high-dose nonsteroidal anti-inflammatory pain medication that should significantly reduce your pain for the next 6 to 8 hours.    Please begin taking Tylenol 1000 mg 3 times daily (every 8 hours) as soon as you pick up your prescriptions from the pharmacy.   This evening, you can begin taking baclofen 10 mg.  This is a highly effective muscle relaxer and antispasmodic which should continue to provide you with relaxation of your tense muscles, allow you to sleep well and to keep your pain under control.  You can continue taking this medication 3 times daily as you need to.  If you find that this medication makes you too sleepy, you can break them in half for your daytime doses and, if needed double them for your nighttime dose.  Do not take more than 30 mg of baclofen in a 24-hour period.   During the day, please set aside time to apply ice to the affected area 4 times daily for 20 minutes each application.  This can be achieved by using a bag of frozen peas or corn, a Ziploc bag filled with ice and water, or Ziploc bag filled with half rubbing alcohol and half Dawn dish detergent, frozen into a slush.  Please be careful not to apply ice directly to your skin, always place a soft cloth between you and the ice pack.   You are welcome to use topical anti-inflammatory creams such as Voltaren gel, capsaicin or Aspercreme as recommended.   These medications are available over-the-counter, please follow manufactures instructions for use.  As a courtesy, I provided you with a prescription for diclofenac in the event that your insurance will pay for this.   Please consider discussing referral to physical therapy with your primary care provider.  Physical therapist are very good at teasing out the underlying cause of acute lower back pain and helping with prevention of future recurrences.   Please avoid attempts to stretch or strengthen the affected area until you are feeling completely pain-free.  Attempts to do so will only prolong the healing process.   I also recommend that you remain out of work for the next several days, I provided you with a note to return to work in 3 days.  If you feel that you need this time extended, please follow-up with your primary care provider or return to urgent care for reevaluation so that we can provide you with a note for another 3 days.   Thank you for visiting urgent care today.  We appreciate the opportunity to participate in your care.
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
## 2022-02-10 DIAGNOSIS — E1169 Type 2 diabetes mellitus with other specified complication: Secondary | ICD-10-CM | POA: Insufficient documentation

## 2022-08-22 IMAGING — CR DG CHEST 2V
2 series · 2 of 2 positions shown · non-contrast
Comparison: None.

CLINICAL DATA: Chest pain.  Hypertension and sternal chest pain.

EXAM:
CHEST - 2 VIEW

[w chest lat]
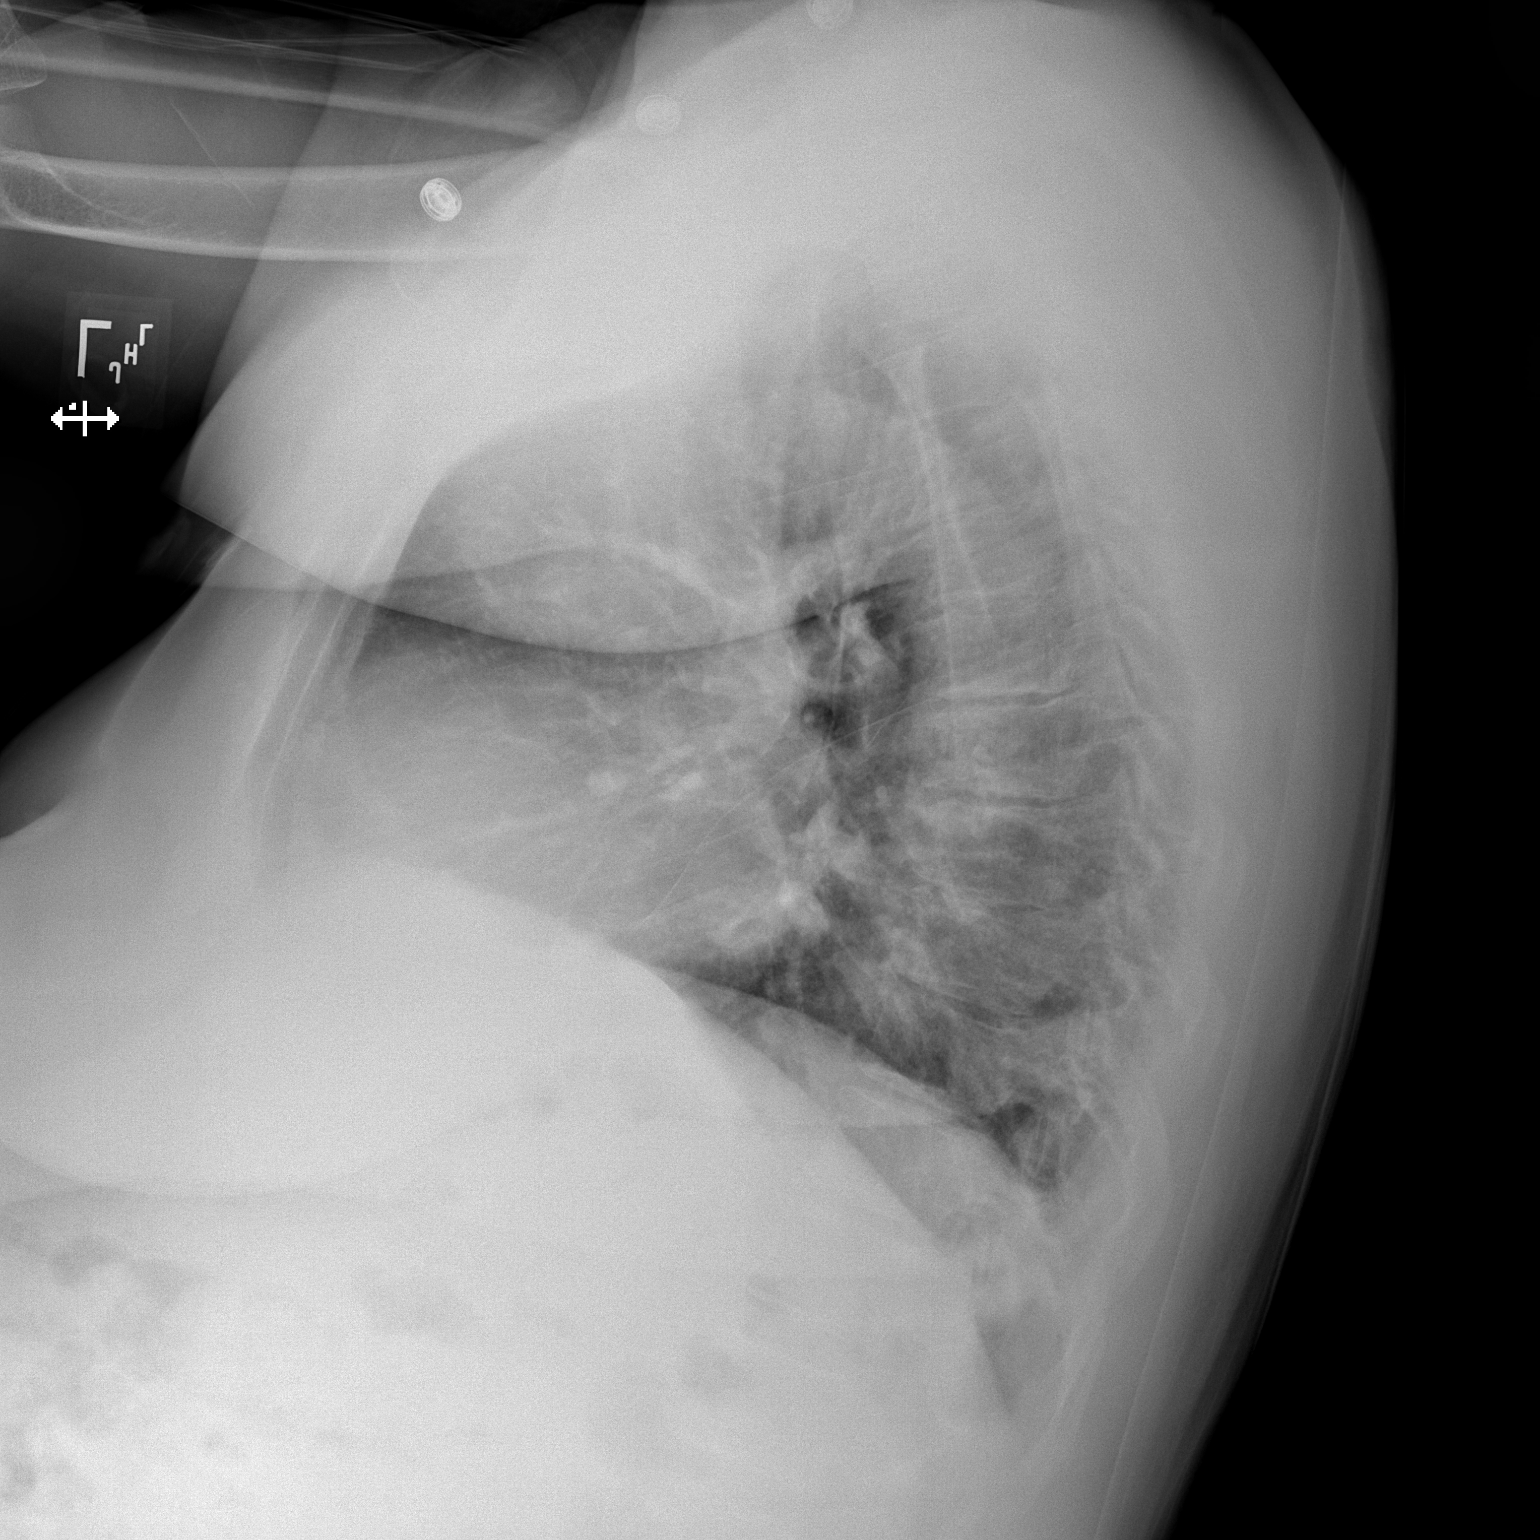

[x chest ap]
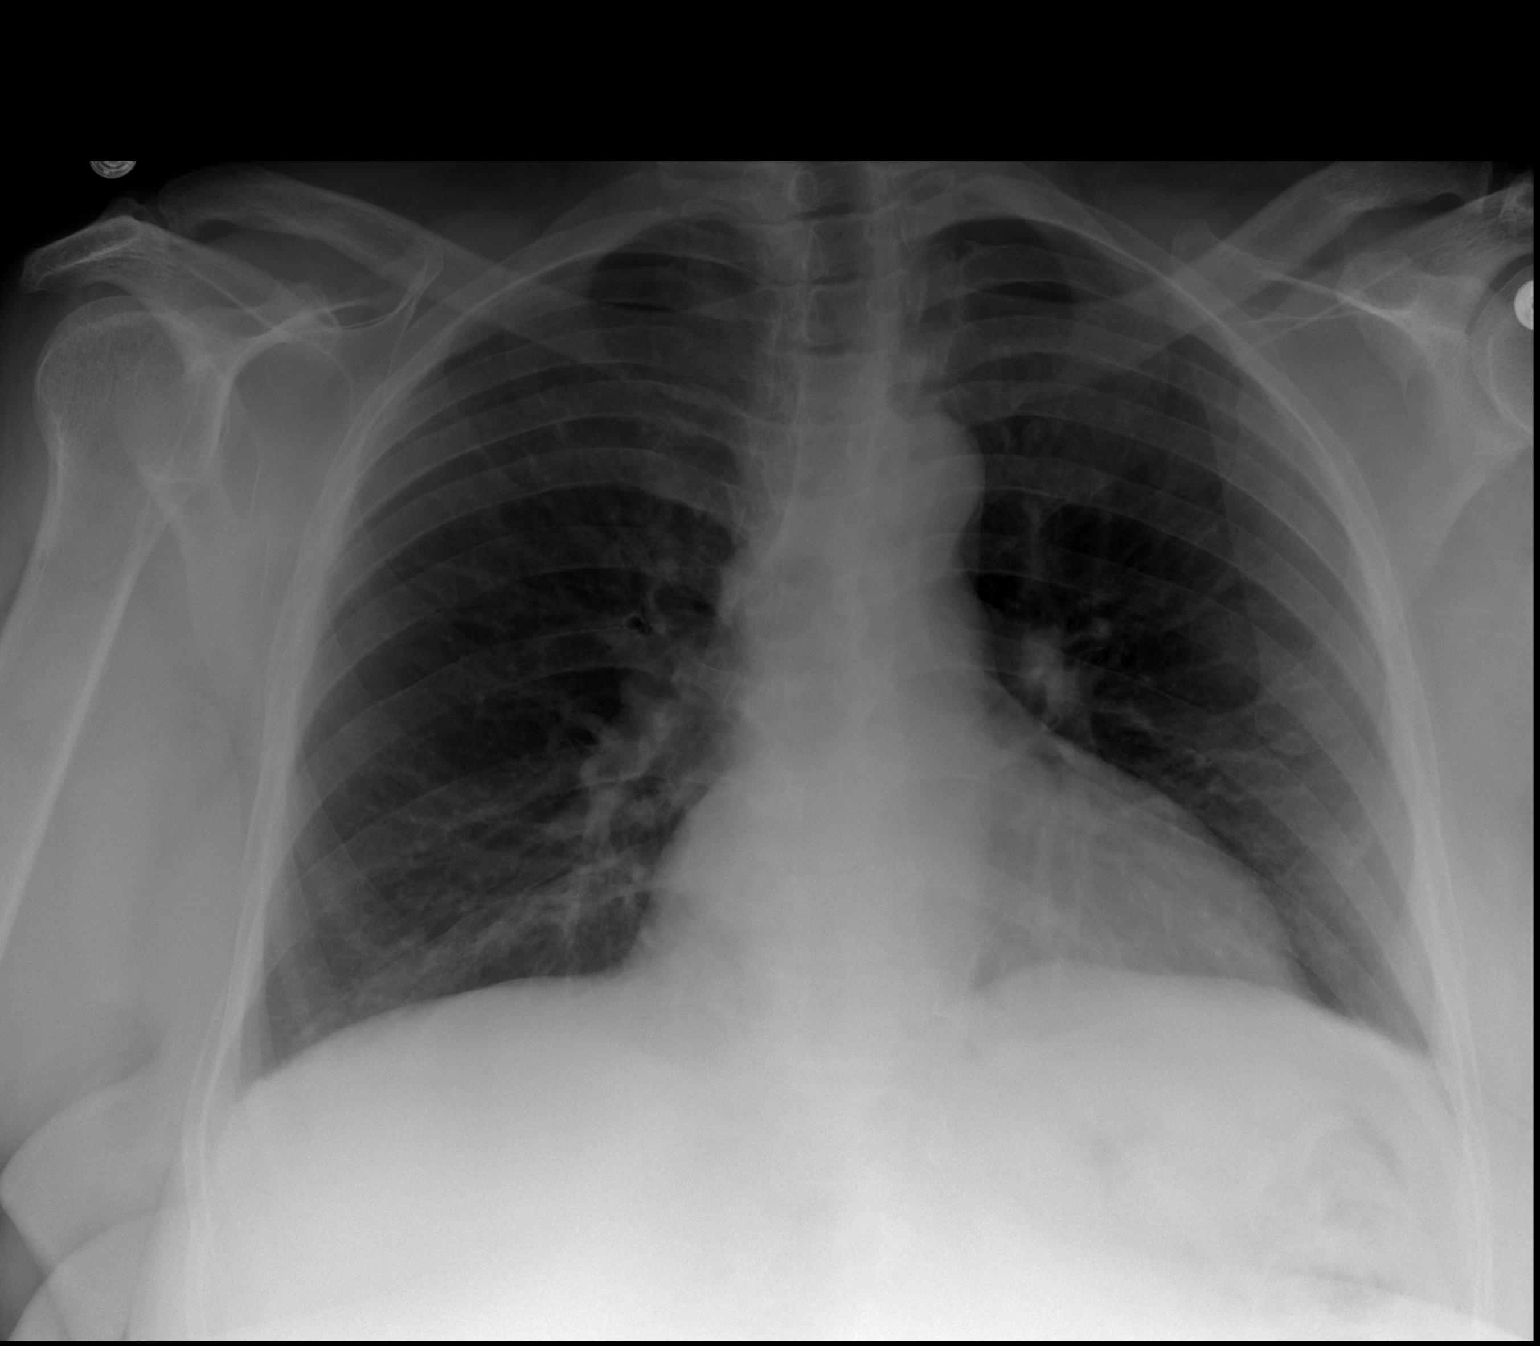

[2 of 2 positions shown; findings below may reference images not displayed]

FINDINGS: Heart size and pulmonary vascularity are normal. Lungs are clear.
Soft tissue opacity in the right cardiophrenic angle likely
represents pericardial cyst or lipoma. Vascular shadow also a
possibility. Mediastinal contours are otherwise intact. No pleural
effusions. No pneumothorax.
IMPRESSION: No evidence of active pulmonary disease.

## 2022-11-26 ENCOUNTER — Ambulatory Visit
Admission: EM | Admit: 2022-11-26 | Discharge: 2022-11-26 | Disposition: A | Discharge status: Home / Self Care | Payer: 59 | Attending: Internal Medicine | Admitting: Internal Medicine

## 2022-11-26 DIAGNOSIS — J029 Acute pharyngitis, unspecified: Secondary | ICD-10-CM | POA: Diagnosis present

## 2022-11-26 DIAGNOSIS — Z9109 Other allergy status, other than to drugs and biological substances: Secondary | ICD-10-CM | POA: Insufficient documentation

## 2022-11-26 LAB — POCT MONO SCREEN (KUC): Mono, POC: NEGATIVE

## 2022-11-26 LAB — POCT RAPID STREP A (OFFICE): Rapid Strep A Screen: NEGATIVE

## 2022-11-26 MED ORDER — CETIRIZINE HCL 10 MG PO CHEW: 10.0000 mg | CHEWABLE_TABLET | Freq: Every day | ORAL | 0 refills | Status: DC

## 2022-11-26 MED ORDER — FLUTICASONE PROPIONATE 50 MCG/ACT NA SUSP: 1.0000 | Freq: Every day | NASAL | 0 refills | Status: DC

## 2022-11-26 NOTE — ED Provider Notes (Signed)
[facility]    CSN: [id number] Arrival date & time: 11/26/22  0954      History   Chief Complaint Chief Complaint  Patient presents with   Sore Throat   Back Pain    HPI Ashley [name] is a 57 y.o. female  presents for evaluation of URI symptoms for 2 weeks. Patient reports associated symptoms of sore throat, throat congestion, fatigue. Denies N/V/D, cough, nasal congestion, headache, fevers, body aches, ear pain, shortness of breath. Patient does not have a hx of asthma. Patient does not have a history of smoking.  Reports no sick contacts.  States symptoms began after being in her daughter's apartment that is currently being treated for mold.  Pt has taken salt water gargles and tea/honey OTC for symptoms.  Also reports she woke up with back pain today but does have a history of chronic low back pain and does take meloxicam for this.  Primary concern is her sore throat.  Pt has no other concerns at this time.    Sore Throat  Back Pain   Past Medical History:  Diagnosis Date   Anemia    Brain tumor (benign) (HCC)    has a tumor watching-no surgery=no tx    Eczema    GERD (gastroesophageal reflux disease)    Headache(784.0)    Heart murmur    dx as a child, no probems as adult   Hypertension    SVD (spontaneous vaginal delivery)    x 2    Patient Active Problem List   Diagnosis Date Noted   Chest pain 09/13/2020   Hypertensive urgency 09/13/2020   Type 2 diabetes mellitus with obesity (HCC) 09/13/2020   History of migraine headaches 09/13/2020    Past Surgical History:  Procedure Laterality Date   ABDOMINAL HYSTERECTOMY     LAPAROSCOPIC ASSISTED VAGINAL HYSTERECTOMY  08/04/2011   Procedure: LAPAROSCOPIC ASSISTED VAGINAL HYSTERECTOMY;  Surgeon: Bing Plume, MD;  Location: [facility];  Service: Gynecology;  Laterality: N/A;   MASS EXCISION  01/15/2012   Procedure: EXCISION MASS;  Surgeon: Lodema Pilot, DO;  Location: [facility];   Service: General;  Laterality: Right;  Excision of lower abdominal wall mass & excision  Right posterior upper leg mass   TUBAL LIGATION  2002   VENTRAL HERNIA REPAIR  01/15/2012   Procedure: HERNIA REPAIR VENTRAL ADULT;  Surgeon: Lodema Pilot, DO;  Location: [facility];  Service: General;  Laterality: N/A;  Excision lower abdomen soft tissue mass (no hernia)   WISDOM TOOTH EXTRACTION      OB History   No obstetric history on file.      Home Medications    Prior to Admission medications   Medication Sig Start Date End Date Taking? Authorizing Provider  cetirizine (ZYRTEC) 10 MG chewable tablet Chew 1 tablet (10 mg total) by mouth daily. 11/26/22  Yes Radford Pax, NP  fluticasone (FLONASE) 50 MCG/ACT nasal spray Place 1 spray into both nostrils daily. 11/26/22  Yes Radford Pax, NP  Ascorbic Acid (VITAMIN C PO) Take 1 tablet by mouth daily.    [provider]  atenolol (TENORMIN) 25 MG tablet Take 25 mg by mouth at bedtime. 07/05/20   [provider]  atorvastatin (LIPITOR) 40 MG tablet Take 1 tablet (40 mg total) by mouth daily. 09/14/20   Marguerita Merles Latif, DO  Cyanocobalamin (VITAMIN B-12 PO) Take 1 tablet by mouth daily.    [provider]  diclofenac  Sodium (VOLTAREN) 1 % GEL Apply 4 g topically 4 (four) times daily. Apply to affected areas 4 times daily as needed for pain. 09/19/21   Theadora Rama Scales, PA-C  DULoxetine (CYMBALTA) 20 MG capsule Take 20 mg by mouth daily.    [provider]  esomeprazole (NEXIUM) 40 MG capsule Take 40 mg by mouth daily before breakfast.    [provider]  gabapentin (NEURONTIN) 300 MG capsule Take 300 mg by mouth 2 (two) times daily.    [provider]  hydrochlorothiazide (HYDRODIURIL) 25 MG tablet Take 25 mg by mouth daily. 09/11/20   [provider]  Levetiracetam 750 MG TB24 Take 750 mg by mouth in the morning and at bedtime. 09/06/20   [provider]   losartan (COZAAR) 100 MG tablet Take 1 tablet (100 mg total) by mouth daily. 09/15/20 10/15/20  Marguerita Merles Latif, DO  metFORMIN (GLUCOPHAGE-XR) 500 MG 24 hr tablet Take 1,000 mg by mouth every morning. 06/18/20   [provider]  nitroGLYCERIN (NITROSTAT) 0.4 MG SL tablet Place 1 tablet (0.4 mg total) under the tongue every 5 (five) minutes as needed for chest pain. 09/14/20   Sheikh, Omair Latif, DO  senna-docusate (SENOKOT-S) 8.6-50 MG tablet Take 1 tablet by mouth at bedtime as needed for mild constipation. 09/14/20   Marguerita Merles Latif, DO  VITAMIN A PO Take 1 tablet by mouth daily.    [provider]  VITAMIN E PO Take 1 tablet by mouth daily.    [provider]  zolmitriptan (ZOMIG) 5 MG tablet Take 5 mg by mouth daily as needed for migraine.    [provider]    Family History Family History  Problem Relation Age of Onset   Diabetes Mother    Hypertension Father    Cancer Maternal Aunt        Ovarian    Social History Social History   Tobacco Use   Smoking status: Former    Current packs/day: 0.00    Average packs/day: 0.3 packs/day for 4.0 years (1.0 ttl pk-yrs)    Types: Cigarettes    Start date: 01/20/1990    Quit date: 01/20/1994    Years since quitting: 28.8   Smokeless tobacco: Never  Substance Use Topics   Alcohol use: Yes    Comment: socially - wine   Drug use: No     Allergies   Penicillins   Review of Systems Review of Systems  HENT:  Positive for sore throat.      Physical Exam Triage Vital Signs ED Triage Vitals  Encounter Vitals Group     BP 11/26/22 1034 (!) 157/83     Systolic BP Percentile --      Diastolic BP Percentile --      Pulse Rate 11/26/22 1034 (!) 53     Resp 11/26/22 1034 17     Temp 11/26/22 1034 97.7 F (36.5 C)     Temp Source 11/26/22 1034 Oral     SpO2 11/26/22 1034 97 %     Weight --      Height --      Head Circumference --      Peak Flow --      Pain Score 11/26/22 1033 7      Pain Loc --      Pain Education --      Exclude from Growth Chart --    No data found.  Updated Vital Signs BP (!) 157/83 (BP Location:  Right Arm)   Pulse (!) 53   Temp 97.7 F (36.5 C) (Oral)   Resp 17   LMP 07/13/2011   SpO2 97%   Visual Acuity Right Eye Distance:   Left Eye Distance:   Bilateral Distance:    Right Eye Near:   Left Eye Near:    Bilateral Near:     Physical Exam Vitals and nursing note reviewed.  Constitutional:      General: She is not in acute distress.    Appearance: She is well-developed. She is not ill-appearing.  HENT:     Head: Normocephalic and atraumatic.     Right Ear: Tympanic membrane and ear canal normal.     Left Ear: Tympanic membrane and ear canal normal.     Nose: No congestion or rhinorrhea.     Mouth/Throat:     Mouth: Mucous membranes are moist.     Pharynx: Oropharynx is clear. Uvula midline. Posterior oropharyngeal erythema present.     Tonsils: No tonsillar exudate or tonsillar abscesses.  Eyes:     Conjunctiva/sclera: Conjunctivae normal.     Pupils: Pupils are equal, round, and reactive to light.  Cardiovascular:     Rate and Rhythm: Normal rate and regular rhythm.     Heart sounds: Normal heart sounds.  Pulmonary:     Effort: Pulmonary effort is normal.     Breath sounds: Normal breath sounds.  Musculoskeletal:     Cervical back: Normal range of motion and neck supple.  Lymphadenopathy:     Cervical: No cervical adenopathy.  Skin:    General: Skin is warm and dry.  Neurological:     General: No focal deficit present.     Mental Status: She is alert and oriented to person, place, and time.  Psychiatric:        Mood and Affect: Mood normal.        Behavior: Behavior normal.      UC Treatments / Results  Labs (all labs ordered are listed, but only abnormal results are displayed) Labs Reviewed  CULTURE, GROUP A STREP [facility])  POCT RAPID STREP A (OFFICE)  POCT MONO SCREEN ([facility])    EKG   Radiology No  results found.  Procedures Procedures (including critical care time)  Medications Ordered in UC Medications - No data to display  Initial Impression / Assessment and Plan / UC Course  I have reviewed the triage vital signs and the nursing notes.  Pertinent labs & imaging results that were available during my care of the patient were reviewed by me and considered in my medical decision making (see chart for details).     Reviewed exam and symptoms with patient.  No red flags.  Negative rapid strep and mono, will send throat culture.  Discussed likely allergy component to this chronic sore throat.  Will do trial of Flonase and cetirizine.  She may continue OTC analgesics/salt gargles and warm liquids.  PCP follow-up 2 to 3 days for recheck.  ER precautions reviewed. Final Clinical Impressions(s) / UC Diagnoses   Final diagnoses:  Sore throat  Environmental allergies     Discharge Instructions      Your strep throat test and your monotest was negative.  Please start cetirizine daily for 2 weeks as well as Flonase daily.  You may continue salt water gargles and warm liquids such as teas and honey.  Please follow-up with your PCP in 2 to 3 days for recheck.  Please go to the ER for  any worsening symptoms.  I hope you feel better soon!     ED Prescriptions     Medication Sig Dispense Auth. Provider   fluticasone (FLONASE) 50 MCG/ACT nasal spray Place 1 spray into both nostrils daily. 15.8 mL Radford Pax, NP   cetirizine (ZYRTEC) 10 MG chewable tablet Chew 1 tablet (10 mg total) by mouth daily. 14 tablet Radford Pax, NP      PDMP not reviewed this encounter.   Radford Pax, NP 11/26/22 1122

## 2022-11-26 NOTE — ED Triage Notes (Signed)
Pt presents with c/o a sore throat x 2 wks. Pt states she was exposed to mold and has been spitting a lot of mucus.    Pt states this morning she wok up with lower back pain.   Home interventions: hot teas

## 2022-11-26 NOTE — Discharge Instructions (Signed)
Your strep throat test and your monotest was negative.  Please start cetirizine daily for 2 weeks as well as Flonase daily.  You may continue salt water gargles and warm liquids such as teas and honey.  Please follow-up with your PCP in 2 to 3 days for recheck.  Please go to the ER for any worsening symptoms.  I hope you feel better soon!

## 2022-11-30 LAB — CULTURE, GROUP A STREP (THRC)

## 2023-02-23 ENCOUNTER — Ambulatory Visit (INDEPENDENT_AMBULATORY_CARE_PROVIDER_SITE_OTHER): Payer: 59 | Admitting: Radiology

## 2023-02-23 ENCOUNTER — Ambulatory Visit
Admission: EM | Admit: 2023-02-23 | Discharge: 2023-02-23 | Disposition: A | Discharge status: Home / Self Care | Payer: 59 | Attending: Family Medicine | Admitting: Family Medicine

## 2023-02-23 ENCOUNTER — Other Ambulatory Visit: Payer: Self-pay

## 2023-02-23 ENCOUNTER — Encounter: Payer: Self-pay | Admitting: Emergency Medicine

## 2023-02-23 DIAGNOSIS — R051 Acute cough: Secondary | ICD-10-CM

## 2023-02-23 DIAGNOSIS — J189 Pneumonia, unspecified organism: Secondary | ICD-10-CM

## 2023-02-23 MED ORDER — ALBUTEROL SULFATE HFA 108 (90 BASE) MCG/ACT IN AERS: 2.0000 | INHALATION_SPRAY | Freq: Four times a day (QID) | RESPIRATORY_TRACT | 0 refills | Status: DC | PRN

## 2023-02-23 MED ORDER — LEVOFLOXACIN 750 MG PO TABS: 750.0000 mg | ORAL_TABLET | Freq: Every day | ORAL | 0 refills | Status: AC

## 2023-02-23 MED ORDER — PROMETHAZINE-DM 6.25-15 MG/5ML PO SYRP: 5.0000 mL | ORAL_SOLUTION | Freq: Four times a day (QID) | ORAL | 0 refills | Status: AC | PRN

## 2023-02-23 NOTE — Discharge Instructions (Signed)
Follow-up with your doctor Go to the emergency department for severe symptoms new symptoms or concerns  The x-ray reading we discussed is preliminary. Your x-ray will be read by a radiologist in next few hours. If there is a discrepancy, you will be contacted, and instructed on a new plan for you care.

## 2023-02-23 NOTE — ED Triage Notes (Signed)
Symptoms for 2 weeks.  Has taken theraflu, alka seltzer cold plus.  Patient has congestion in chest and head, phlegm in throat, sore throat, fever and chest hurts -coughing or deep breathing makes chest hurt

## 2023-02-23 NOTE — ED Provider Notes (Signed)
[facility]    CSN: [id number] Arrival date & time: 02/23/23  1811      History   Chief Complaint Chief Complaint  Patient presents with   URI    HPI ARDA DAGGS is a 58 y.o. female.   The history is provided by the patient.  URI Presenting symptoms: congestion, cough, fatigue, fever, rhinorrhea and sore throat   Associated symptoms: sinus pain   Associated symptoms: no wheezing   Has been sick for 2 weeks was exposed to the flu by her grandchildren her symptoms include cough chest congestion rhinorrhea, nasal, postnasal drip, sinus pain and pressure, headache, fatigue.  Had fever over the weekend, had some chest discomfort and shortness of breath.  Taking over-the-counter medications without relief.  Denies wheezing or history of asthma.  Past Medical History:  Diagnosis Date   Anemia    Brain tumor (benign) (HCC)    has a tumor watching-no surgery=no tx    Eczema    GERD (gastroesophageal reflux disease)    Headache(784.0)    Heart murmur    dx as a child, no probems as adult   Hypertension    SVD (spontaneous vaginal delivery)    x 2    Patient Active Problem List   Diagnosis Date Noted   Chest pain 09/13/2020   Hypertensive urgency 09/13/2020   Type 2 diabetes mellitus with obesity (HCC) 09/13/2020   History of migraine headaches 09/13/2020    Past Surgical History:  Procedure Laterality Date   ABDOMINAL HYSTERECTOMY     LAPAROSCOPIC ASSISTED VAGINAL HYSTERECTOMY  08/04/2011   Procedure: LAPAROSCOPIC ASSISTED VAGINAL HYSTERECTOMY;  Surgeon: Bing Plume, MD;  Location: [facility];  Service: Gynecology;  Laterality: N/A;   MASS EXCISION  01/15/2012   Procedure: EXCISION MASS;  Surgeon: Lodema Pilot, DO;  Location: [facility];  Service: General;  Laterality: Right;  Excision of lower abdominal wall mass & excision  Right posterior upper leg mass   TUBAL LIGATION  2002   VENTRAL HERNIA REPAIR  01/15/2012   Procedure: HERNIA  REPAIR VENTRAL ADULT;  Surgeon: Lodema Pilot, DO;  Location: [facility];  Service: General;  Laterality: N/A;  Excision lower abdomen soft tissue mass (no hernia)   WISDOM TOOTH EXTRACTION      OB History   No obstetric history on file.      Home Medications    Prior to Admission medications   Medication Sig Start Date End Date Taking? Authorizing Provider  Ascorbic Acid (VITAMIN C PO) Take 1 tablet by mouth daily.    [provider]  atenolol (TENORMIN) 25 MG tablet Take 25 mg by mouth at bedtime. 07/05/20   [provider]  atorvastatin (LIPITOR) 40 MG tablet Take 1 tablet (40 mg total) by mouth daily. 09/14/20   Marguerita Merles Latif, DO  cetirizine (ZYRTEC) 10 MG chewable tablet Chew 1 tablet (10 mg total) by mouth daily. 11/26/22   Radford Pax, NP  Cyanocobalamin (VITAMIN B-12 PO) Take 1 tablet by mouth daily.    [provider]  diclofenac Sodium (VOLTAREN) 1 % GEL Apply 4 g topically 4 (four) times daily. Apply to affected areas 4 times daily as needed for pain. 09/19/21   Theadora Rama Scales, PA-C  DULoxetine (CYMBALTA) 20 MG capsule Take 20 mg by mouth daily.    [provider]  esomeprazole (NEXIUM) 40 MG capsule Take 40 mg by mouth daily before breakfast.    [provider]  fluticasone (FLONASE) 50 MCG/ACT nasal spray Place 1 spray into both nostrils daily. 11/26/22   Radford Pax, NP  gabapentin (NEURONTIN) 300 MG capsule Take 300 mg by mouth 2 (two) times daily.    [provider]  hydrochlorothiazide (HYDRODIURIL) 25 MG tablet Take 25 mg by mouth daily. 09/11/20   [provider]  Levetiracetam 750 MG TB24 Take 750 mg by mouth in the morning and at bedtime. 09/06/20   [provider]  losartan (COZAAR) 100 MG tablet Take 1 tablet (100 mg total) by mouth daily. 09/15/20 10/15/20  Marguerita Merles Latif, DO  metFORMIN (GLUCOPHAGE-XR) 500 MG 24 hr tablet Take 1,000 mg by mouth every morning. 06/18/20    [provider]  nitroGLYCERIN (NITROSTAT) 0.4 MG SL tablet Place 1 tablet (0.4 mg total) under the tongue every 5 (five) minutes as needed for chest pain. 09/14/20   Sheikh, Omair Latif, DO  senna-docusate (SENOKOT-S) 8.6-50 MG tablet Take 1 tablet by mouth at bedtime as needed for mild constipation. 09/14/20   Marguerita Merles Latif, DO  VITAMIN A PO Take 1 tablet by mouth daily.    [provider]  VITAMIN E PO Take 1 tablet by mouth daily.    [provider]  zolmitriptan (ZOMIG) 5 MG tablet Take 5 mg by mouth daily as needed for migraine.    [provider]    Family History Family History  Problem Relation Age of Onset   Diabetes Mother    Hypertension Father    Cancer Maternal Aunt        Ovarian    Social History Social History   Tobacco Use   Smoking status: Former    Current packs/day: 0.00    Average packs/day: 0.3 packs/day for 4.0 years (1.0 ttl pk-yrs)    Types: Cigarettes    Start date: 01/20/1990    Quit date: 01/20/1994    Years since quitting: 29.1   Smokeless tobacco: Never  Vaping Use   Vaping status: Never Used  Substance Use Topics   Alcohol use: Yes    Comment: socially - wine   Drug use: No     Allergies   Penicillins   Review of Systems Review of Systems  Constitutional:  Positive for chills, fatigue and fever.  HENT:  Positive for congestion, postnasal drip, rhinorrhea, sinus pain and sore throat. Negative for ear discharge.   Respiratory:  Positive for cough and shortness of breath. Negative for wheezing.   Cardiovascular:  Positive for chest pain.  Neurological:  Negative for dizziness.     Physical Exam Triage Vital Signs ED Triage Vitals  Encounter Vitals Group     BP 02/23/23 1832 (!) 143/80     Systolic BP Percentile --      Diastolic BP Percentile --      Pulse Rate 02/23/23 1832 (!) 113     Resp 02/23/23 1832 18     Temp 02/23/23 1832 99.8 F (37.7 C)     Temp Source 02/23/23 1832 Oral      SpO2 02/23/23 1832 95 %     Weight --      Height --      Head Circumference --      Peak Flow --      Pain Score 02/23/23 1849 7     Pain Loc --      Pain Education --      Exclude from Growth Chart --    No data found.  Updated  Vital Signs BP (!) 143/80 (BP Location: Right Arm)   Pulse (!) 113   Temp 99.8 F (37.7 C) (Oral)   Resp 18   LMP 07/13/2011   SpO2 95%   Visual Acuity Right Eye Distance:   Left Eye Distance:   Bilateral Distance:    Right Eye Near:   Left Eye Near:    Bilateral Near:     Physical Exam Vitals and nursing note reviewed.  Constitutional:      Appearance: She is not ill-appearing.  HENT:     Head: Normocephalic and atraumatic.     Right Ear: Tympanic membrane and ear canal normal.     Left Ear: Tympanic membrane and ear canal normal.     Nose: Congestion present.     Mouth/Throat:     Mouth: Mucous membranes are moist.     Pharynx: No oropharyngeal exudate or posterior oropharyngeal erythema.  Eyes:     Conjunctiva/sclera: Conjunctivae normal.  Cardiovascular:     Rate and Rhythm: Regular rhythm. Tachycardia present.     Heart sounds: Normal heart sounds.  Pulmonary:     Effort: Pulmonary effort is normal. No respiratory distress.     Breath sounds: Rhonchi present. No wheezing or rales.  Chest:     Chest wall: No tenderness.  Musculoskeletal:     Cervical back: Neck supple.  Skin:    General: Skin is warm and dry.  Neurological:     Mental Status: She is alert and oriented to person, place, and time.      UC Treatments / Results  Labs (all labs ordered are listed, but only abnormal results are displayed) Labs Reviewed - No data to display  EKG   Radiology No results found.  Procedures Procedures (including critical care time)  Medications Ordered in UC Medications - No data to display  Initial Impression / Assessment and Plan / UC Course  I have reviewed the triage vital signs and the nursing notes.  Pertinent  labs & imaging results that were available during my care of the patient were reviewed by me and considered in my medical decision making (see chart for details).     58 year old reports for 2 weeks nasal congestion, sinus pain or pressure, postnasal drip, has occasional sputum.  Reports fever and shortness of breath over the last few days.  She has low-grade fever 99. 8 mildly hypertensive 143/80 tachycardic pulse ox is normal. She is nontoxic-appearing has some rhonchi on exam, chest x-ray independently viewed by me no obvious infiltrate.  Will treat for community-acquired pneumonia based on symptoms and exam.  Strict ED precautions reviewed with Final Clinical Impressions(s) / UC Diagnoses   Final diagnoses:  Acute cough   Discharge Instructions   None    ED Prescriptions   None    PDMP not reviewed this encounter.   Charleene, [place], [place] 02/23/23 [phone]

## 2023-04-11 ENCOUNTER — Emergency Department (HOSPITAL_COMMUNITY)

## 2023-04-11 ENCOUNTER — Other Ambulatory Visit: Payer: Self-pay

## 2023-04-11 ENCOUNTER — Encounter (HOSPITAL_COMMUNITY): Payer: Self-pay | Admitting: Emergency Medicine

## 2023-04-11 ENCOUNTER — Emergency Department (HOSPITAL_COMMUNITY)
Admission: EM | Admit: 2023-04-11 | Discharge: 2023-04-12 | Disposition: A | Discharge status: Home / Self Care | Attending: Emergency Medicine | Admitting: Emergency Medicine

## 2023-04-11 DIAGNOSIS — Z79899 Other long term (current) drug therapy: Secondary | ICD-10-CM | POA: Insufficient documentation

## 2023-04-11 DIAGNOSIS — I10 Essential (primary) hypertension: Secondary | ICD-10-CM | POA: Insufficient documentation

## 2023-04-11 DIAGNOSIS — R059 Cough, unspecified: Secondary | ICD-10-CM | POA: Diagnosis not present

## 2023-04-11 DIAGNOSIS — R072 Precordial pain: Secondary | ICD-10-CM | POA: Diagnosis present

## 2023-04-11 DIAGNOSIS — R0602 Shortness of breath: Secondary | ICD-10-CM | POA: Insufficient documentation

## 2023-04-11 DIAGNOSIS — D649 Anemia, unspecified: Secondary | ICD-10-CM | POA: Diagnosis not present

## 2023-04-11 LAB — CBC
HCT: 37.6 % (ref 36.0–46.0)
Hemoglobin: 11.7 g/dL — ABNORMAL LOW (ref 12.0–15.0)
MCH: 22.9 pg — ABNORMAL LOW (ref 26.0–34.0)
MCHC: 31.1 g/dL (ref 30.0–36.0)
MCV: 73.6 fL — ABNORMAL LOW (ref 80.0–100.0)
Platelets: 352 10*3/uL (ref 150–400)
RBC: 5.11 MIL/uL (ref 3.87–5.11)
RDW: 16.7 % — ABNORMAL HIGH (ref 11.5–15.5)
WBC: 8 10*3/uL (ref 4.0–10.5)
nRBC: 0 % (ref 0.0–0.2)

## 2023-04-11 LAB — BASIC METABOLIC PANEL
Anion gap: 9 (ref 5–15)
BUN: 13 mg/dL (ref 6–20)
CO2: 21 mmol/L — ABNORMAL LOW (ref 22–32)
Calcium: 8.6 mg/dL — ABNORMAL LOW (ref 8.9–10.3)
Chloride: 109 mmol/L (ref 98–111)
Creatinine, Ser: 0.88 mg/dL (ref 0.44–1.00)
GFR, Estimated: >60 mL/min (ref >60)
Glucose, Bld: 132 mg/dL — ABNORMAL HIGH (ref 70–99)
Potassium: 3.8 mmol/L (ref 3.5–5.1)
Sodium: 139 mmol/L (ref 135–145)

## 2023-04-11 LAB — RESP PANEL BY RT-PCR (RSV, FLU A&B, COVID)  RVPGX2
Influenza A by PCR: NEGATIVE
Influenza B by PCR: NEGATIVE
Resp Syncytial Virus by PCR: NEGATIVE
SARS Coronavirus 2 by RT PCR: NEGATIVE

## 2023-04-11 LAB — TROPONIN I (HIGH SENSITIVITY): Troponin I (High Sensitivity): 4 ng/L (ref <18)

## 2023-04-11 NOTE — ED Triage Notes (Signed)
 Pt in with tight, burning central cp since yesterday. Pt states she thought it was reflux yesterday, but thinks it's r/t when she got caught in the rain recently, has now developed a cough x 2 days. Did have PNA last month, denies fevers. Reports sob, inhaler bringing no relief

## 2023-04-12 LAB — TROPONIN I (HIGH SENSITIVITY): Troponin I (High Sensitivity): 4 ng/L (ref <18)

## 2023-04-12 MED ORDER — ALUM & MAG HYDROXIDE-SIMETH 200-200-20 MG/5ML PO SUSP
30.0000 mL | Freq: Once | ORAL | Status: AC
  Administered 2023-04-12: 30 mL via ORAL
  Filled 2023-04-12: qty 30

## 2023-04-12 NOTE — ED Provider Notes (Signed)
 [place] EMERGENCY DEPARTMENT AT [facility] Provider Note   CSN: [id number] Arrival date & time: 04/11/23  2049     History  Chief Complaint  Patient presents with   Chest Pain    Ashley [name] is a 58 y.o. female.  The history is provided by the patient.  Patient with history of hypertension presents with chest pain.  She reports for at least 3 days she has had consistent chest tightness and felt like she has heartburn.  This all started while she got caught in the rain while catching the bus and she reports cough and congestion.  She is concerned she may have pneumonia No fevers or vomiting.  She does report shortness of breath with her cough. No known history of CAD    Past Medical History:  Diagnosis Date   Anemia    Brain tumor (benign) (HCC)    has a tumor watching-no surgery=no tx    Eczema    GERD (gastroesophageal reflux disease)    Headache(784.0)    Heart murmur    dx as a child, no probems as adult   Hypertension    SVD (spontaneous vaginal delivery)    x 2    Home Medications Prior to Admission medications   Medication Sig Start Date End Date Taking? Authorizing Provider  albuterol (VENTOLIN HFA) 108 (90 Base) MCG/ACT inhaler Inhale 2 puffs into the lungs every 6 (six) hours as needed for up to 10 days for wheezing or shortness of breath. 02/23/23 03/05/23  Meliton Rattan, PA  Ascorbic Acid (VITAMIN C PO) Take 1 tablet by mouth daily.    [provider]  atenolol (TENORMIN) 25 MG tablet Take 25 mg by mouth at bedtime. 07/05/20   [provider]  atorvastatin (LIPITOR) 40 MG tablet Take 1 tablet (40 mg total) by mouth daily. 09/14/20   Marguerita Merles Latif, DO  cetirizine (ZYRTEC) 10 MG chewable tablet Chew 1 tablet (10 mg total) by mouth daily. 11/26/22   Radford Pax, NP  Cyanocobalamin (VITAMIN B-12 PO) Take 1 tablet by mouth daily.    [provider]  diclofenac Sodium (VOLTAREN) 1 % GEL Apply 4 g topically 4 (four)  times daily. Apply to affected areas 4 times daily as needed for pain. 09/19/21   Theadora Rama Scales, PA-C  DULoxetine (CYMBALTA) 20 MG capsule Take 20 mg by mouth daily.    [provider]  esomeprazole (NEXIUM) 40 MG capsule Take 40 mg by mouth daily before breakfast.    [provider]  fluticasone (FLONASE) 50 MCG/ACT nasal spray Place 1 spray into both nostrils daily. 11/26/22   Radford Pax, NP  gabapentin (NEURONTIN) 300 MG capsule Take 300 mg by mouth 2 (two) times daily.    [provider]  hydrochlorothiazide (HYDRODIURIL) 25 MG tablet Take 25 mg by mouth daily. 09/11/20   [provider]  Levetiracetam 750 MG TB24 Take 750 mg by mouth in the morning and at bedtime. 09/06/20   [provider]  losartan (COZAAR) 100 MG tablet Take 1 tablet (100 mg total) by mouth daily. 09/15/20 10/15/20  Marguerita Merles Latif, DO  metFORMIN (GLUCOPHAGE-XR) 500 MG 24 hr tablet Take 1,000 mg by mouth every morning. 06/18/20   [provider]  nitroGLYCERIN (NITROSTAT) 0.4 MG SL tablet Place 1 tablet (0.4 mg total) under the tongue every 5 (five) minutes as needed for chest pain. 09/14/20   Sheikh, Omair Latif, DO  senna-docusate (SENOKOT-S) 8.6-50 MG tablet  Take 1 tablet by mouth at bedtime as needed for mild constipation. 09/14/20   Marguerita Merles Latif, DO  VITAMIN A PO Take 1 tablet by mouth daily.    [provider]  VITAMIN E PO Take 1 tablet by mouth daily.    [provider]  zolmitriptan (ZOMIG) 5 MG tablet Take 5 mg by mouth daily as needed for migraine.    [provider]      Allergies    Penicillins    Review of Systems   Review of Systems  Constitutional:  Negative for fever.  HENT:  Positive for congestion.   Cardiovascular:  Positive for chest pain.    Physical Exam Updated Vital Signs BP (!) 158/80 (BP Location: Right Arm)   Pulse 79   Temp 98.1 F (36.7 C)   Resp 20   Wt 90.7 kg   LMP 07/13/2011    SpO2 97%   BMI 31.32 kg/m  Physical Exam CONSTITUTIONAL: Well developed/well nourished, no distress HEAD: Normocephalic/atraumatic EYES: EOMI ENMT: Mucous membranes moist NECK: supple no meningeal signs CV: S1/S2 noted, no murmurs/rubs/gallops noted LUNGS: Lungs are clear to auscultation bilaterally, no apparent distress ABDOMEN: soft, nontender NEURO: Pt is awake/alert/appropriate, moves all extremitiesx4.  No facial droop.   EXTREMITIES: pulses normal/equalx4, full ROM, no lower extremity edema SKIN: warm, color normal PSYCH: no abnormalities of mood noted, alert and oriented to situation  ED Results / Procedures / Treatments   Labs (all labs ordered are listed, but only abnormal results are displayed) Labs Reviewed  BASIC METABOLIC PANEL - Abnormal; Notable for the following components:      Result Value   CO2 21 (*)    Glucose, Bld 132 (*)    Calcium 8.6 (*)    All other components within normal limits  CBC - Abnormal; Notable for the following components:   Hemoglobin 11.7 (*)    MCV 73.6 (*)    MCH 22.9 (*)    RDW 16.7 (*)    All other components within normal limits  RESP PANEL BY RT-PCR (RSV, FLU A&B, COVID)  RVPGX2  TROPONIN I (HIGH SENSITIVITY)  TROPONIN I (HIGH SENSITIVITY)    EKG EKG Interpretation Date/Time:  Saturday April 11 2023 21:24:28 EDT Ventricular Rate:  79 PR Interval:  154 QRS Duration:  74 QT Interval:  412 QTC Calculation: 472 R Axis:   66  Text Interpretation: Normal sinus rhythm Normal ECG No significant change since last tracing Confirmed by Zadie Rhine ([id number]) on 04/12/2023 1:10:22 AM  Radiology DG Chest 2 View Result Date: 04/11/2023 CLINICAL DATA:  cp EXAM: CHEST - 2 VIEW COMPARISON:  Chest x-ray 02/23/2023 FINDINGS: The heart and mediastinal contours are within normal limits. No focal consolidation. No pulmonary edema. No pleural effusion. No pneumothorax. No acute osseous abnormality. IMPRESSION: No active cardiopulmonary  disease. Electronically Signed   By: Tish Frederickson M.D.   On: 04/11/2023 22:28    Procedures Procedures    Medications Ordered in ED Medications  alum & mag hydroxide-simeth (MAALOX/MYLANTA) 200-200-20 MG/5ML suspension 30 mL (has no administration in time range)    ED Course/ Medical Decision Making/ A&P             HEART Score: 3                    Medical Decision Making Amount and/or Complexity of Data Reviewed Labs: ordered. Radiology: ordered.  Risk OTC drugs.   This patient presents to the ED for concern  of chest pain, this involves an extensive number of treatment options, and is a complaint that carries with it a high risk of complications and morbidity.  The differential diagnosis includes but is not limited to acute coronary syndrome, aortic dissection, pulmonary embolism, pericarditis, pneumothorax, pneumonia, myocarditis, pleurisy, esophageal rupture    Comorbidities that complicate the patient evaluation: Patient's presentation is complicated by their history of hypertension  Social Determinants of Health: Patient's  former tobacco use   and housing insecurity increases the complexity of managing their presentation  Additional history obtained: Records reviewed  previous records reviewed  Lab Tests: I Ordered, and personally interpreted labs.  The pertinent results include: Anemia on labs  Imaging Studies ordered: I ordered imaging studies including X-ray chest   I independently visualized and interpreted imaging which showed no acute findings I agree with the radiologist interpretation   Medicines ordered and prescription drug management: I ordered medication including Maalox for heartburn  Test Considered: Patient is low risk / negative by heart score, therefore do not feel that cardiac admission is indicated.  Reevaluation: After the interventions noted above, I reevaluated the patient and found that they have :stayed the same  Complexity of  problems addressed: Patient's presentation is most consistent with  acute presentation with potential threat to life or bodily function  Disposition: After consideration of the diagnostic results and the patient's response to treatment,  I feel that the patent would benefit from discharge   .    Patient with consistent chest tightness and reported heartburn for several days.  She is in no acute distress.  Troponins are negative.  I have low suspicion for PE or other acute cardiopulmonary emergency We discussed strict return precautions.  Advised PCP follow-up       Final Clinical Impression(s) / ED Diagnoses Final diagnoses:  Precordial pain    Rx / DC Orders ED Discharge Orders     None         Zadie Rhine, MD 04/12/23 [phone]

## 2023-04-12 NOTE — ED Notes (Signed)
Pt called X4 to go to a room. Pt could not be found.

## 2023-04-12 NOTE — ED Notes (Signed)
 ..  The patient is A&OX4, ambulatory at d/c with independent steady gait, NAD. Pt verbalized understanding of d/c instructions and follow up care.

## 2023-04-12 NOTE — Discharge Instructions (Signed)

## 2023-04-28 ENCOUNTER — Emergency Department (HOSPITAL_COMMUNITY)
Admission: EM | Admit: 2023-04-28 | Discharge: 2023-04-29 | Disposition: A | Discharge status: Home / Self Care | Attending: Emergency Medicine | Admitting: Emergency Medicine

## 2023-04-28 DIAGNOSIS — Z79899 Other long term (current) drug therapy: Secondary | ICD-10-CM | POA: Insufficient documentation

## 2023-04-28 DIAGNOSIS — M19071 Primary osteoarthritis, right ankle and foot: Secondary | ICD-10-CM | POA: Diagnosis not present

## 2023-04-28 DIAGNOSIS — M79671 Pain in right foot: Secondary | ICD-10-CM

## 2023-04-28 DIAGNOSIS — I10 Essential (primary) hypertension: Secondary | ICD-10-CM | POA: Insufficient documentation

## 2023-04-28 DIAGNOSIS — M19079 Primary osteoarthritis, unspecified ankle and foot: Secondary | ICD-10-CM

## 2023-04-29 ENCOUNTER — Encounter (HOSPITAL_COMMUNITY): Payer: Self-pay

## 2023-04-29 ENCOUNTER — Other Ambulatory Visit: Payer: Self-pay

## 2023-04-29 ENCOUNTER — Emergency Department (HOSPITAL_COMMUNITY)

## 2023-04-29 MED ORDER — ACETAMINOPHEN 325 MG PO TABS
650.0000 mg | ORAL_TABLET | Freq: Once | ORAL | Status: AC
  Administered 2023-04-29: 650 mg via ORAL
  Filled 2023-04-29: qty 2

## 2023-04-29 NOTE — Discharge Instructions (Signed)
 For pain control you may take 1000 mg of acetaminophen (Tylenol) every 8 hours as needed. Please limit acetaminophen (Tylenol) to 4000 mg for a 24hr period. Please note that other over-the-counter medicine may contain acetaminophen as a component of their ingredients.

## 2023-04-29 NOTE — ED Provider Notes (Signed)
 [place] EMERGENCY DEPARTMENT AT [facility] Provider Note  CSN: [id number] Arrival date & time: 04/28/23 2344  Chief Complaint(s) Foot Pain Pt reports walking on the treadmill about 2 hrs ago when she started feeling tingling/burning in right foot. Mild swelling noted. Sensation and pulses intact.   HPI Ashley [name] is a 58 y.o. female     Foot Pain This is a new problem. The current episode started 3 to 5 hours ago. The problem occurs constantly. The problem has been gradually improving. Pertinent negatives include no chest pain, no abdominal pain, no headaches and no shortness of breath. The symptoms are aggravated by walking. The symptoms are relieved by rest. She has tried nothing for the symptoms.    Past Medical History Past Medical History:  Diagnosis Date   Anemia    Brain tumor (benign) (HCC)    has a tumor watching-no surgery=no tx    Eczema    GERD (gastroesophageal reflux disease)    Headache(784.0)    Heart murmur    dx as a child, no probems as adult   Hypertension    SVD (spontaneous vaginal delivery)    x 2   Patient Active Problem List   Diagnosis Date Noted   Chest pain 09/13/2020   Hypertensive urgency 09/13/2020   Type 2 diabetes mellitus with obesity (HCC) 09/13/2020   History of migraine headaches 09/13/2020   Home Medication(s) Prior to Admission medications   Medication Sig Start Date End Date Taking? Authorizing Provider  albuterol (VENTOLIN HFA) 108 (90 Base) MCG/ACT inhaler Inhale 2 puffs into the lungs every 6 (six) hours as needed for up to 10 days for wheezing or shortness of breath. 02/23/23 03/05/23  Meliton Rattan, PA  Ascorbic Acid (VITAMIN C PO) Take 1 tablet by mouth daily.    [provider]  atenolol (TENORMIN) 25 MG tablet Take 25 mg by mouth at bedtime. 07/05/20   [provider]  atorvastatin (LIPITOR) 40 MG tablet Take 1 tablet (40 mg total) by mouth daily. 09/14/20   Marguerita Merles Latif, DO   cetirizine (ZYRTEC) 10 MG chewable tablet Chew 1 tablet (10 mg total) by mouth daily. 11/26/22   Radford Pax, NP  Cyanocobalamin (VITAMIN B-12 PO) Take 1 tablet by mouth daily.    [provider]  diclofenac Sodium (VOLTAREN) 1 % GEL Apply 4 g topically 4 (four) times daily. Apply to affected areas 4 times daily as needed for pain. 09/19/21   Theadora Rama Scales, PA-C  DULoxetine (CYMBALTA) 20 MG capsule Take 20 mg by mouth daily.    [provider]  esomeprazole (NEXIUM) 40 MG capsule Take 40 mg by mouth daily before breakfast.    [provider]  fluticasone (FLONASE) 50 MCG/ACT nasal spray Place 1 spray into both nostrils daily. 11/26/22   Radford Pax, NP  gabapentin (NEURONTIN) 300 MG capsule Take 300 mg by mouth 2 (two) times daily.    [provider]  hydrochlorothiazide (HYDRODIURIL) 25 MG tablet Take 25 mg by mouth daily. 09/11/20   [provider]  Levetiracetam 750 MG TB24 Take 750 mg by mouth in the morning and at bedtime. 09/06/20   [provider]  losartan (COZAAR) 100 MG tablet Take 1 tablet (100 mg total) by mouth daily. 09/15/20 10/15/20  Marguerita Merles Latif, DO  metFORMIN (GLUCOPHAGE-XR) 500 MG 24 hr tablet Take 1,000 mg by mouth every morning. 06/18/20   [provider]  nitroGLYCERIN (NITROSTAT) 0.4 MG SL tablet  Place 1 tablet (0.4 mg total) under the tongue every 5 (five) minutes as needed for chest pain. 09/14/20   Sheikh, Omair Latif, DO  senna-docusate (SENOKOT-S) 8.6-50 MG tablet Take 1 tablet by mouth at bedtime as needed for mild constipation. 09/14/20   Marguerita Merles Latif, DO  VITAMIN A PO Take 1 tablet by mouth daily.    [provider]  VITAMIN E PO Take 1 tablet by mouth daily.    [provider]  zolmitriptan (ZOMIG) 5 MG tablet Take 5 mg by mouth daily as needed for migraine.    [provider]                                                                                                                                     Allergies Penicillins  Review of Systems Review of Systems  Respiratory:  Negative for shortness of breath.   Cardiovascular:  Negative for chest pain.  Gastrointestinal:  Negative for abdominal pain.  Neurological:  Negative for headaches.   As noted in HPI  Physical Exam Vital Signs  I have reviewed the triage vital signs BP 135/70 (BP Location: Left Arm)   Pulse 64   Temp 97.8 F (36.6 C) (Oral)   Resp 18   Ht 5\' 6"  (1.676 m)   Wt 93 kg   LMP 07/13/2011   SpO2 99%   BMI 33.09 kg/m   Physical Exam Vitals reviewed.  Constitutional:      General: She is not in acute distress.    Appearance: She is well-developed. She is not diaphoretic.  HENT:     Head: Normocephalic and atraumatic.     Right Ear: External ear normal.     Left Ear: External ear normal.     Nose: Nose normal.  Eyes:     General: No scleral icterus.    Conjunctiva/sclera: Conjunctivae normal.  Neck:     Trachea: Phonation normal.  Cardiovascular:     Rate and Rhythm: Normal rate and regular rhythm.  Pulmonary:     Effort: Pulmonary effort is normal. No respiratory distress.     Breath sounds: No stridor.  Abdominal:     General: There is no distension.  Musculoskeletal:        General: Normal range of motion.     Cervical back: Normal range of motion.  Feet:     Right foot:     Skin integrity: Callus (small toe) and dry skin present. No ulcer, blister, skin breakdown, erythema or warmth.     Left foot:     Skin integrity: No ulcer, blister, skin breakdown, erythema or warmth.  Neurological:     Mental Status: She is alert and oriented to person, place, and time.  Psychiatric:        Behavior: Behavior normal.     ED Results and Treatments Labs (all labs ordered are listed, but only abnormal results are  displayed) Labs Reviewed - No data to display                                                                                                                        EKG  EKG Interpretation Date/Time:    Ventricular Rate:    PR Interval:    QRS Duration:    QT Interval:    QTC Calculation:   R Axis:      Text Interpretation:         Radiology DG Foot Complete Right Result Date: 04/29/2023 CLINICAL DATA:  Right foot pain and swelling EXAM: RIGHT FOOT COMPLETE - 3+ VIEW COMPARISON:  None Available. FINDINGS: Normal alignment. No acute fracture or dislocation. 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 Remaining joint spaces are preserved. Soft tissues are unremarkable. IMPRESSION: 1. 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  She Electronically Signed   By: Helyn Numbers M.D.   On: 04/29/2023 03:30    Medications Ordered in ED Medications  acetaminophen (TYLENOL) tablet 650 mg (has no administration in time range)   Procedures Procedures  (including critical care time) Medical Decision Making / ED Course   Medical Decision Making Amount and/or Complexity of Data Reviewed Radiology: ordered and independent interpretation performed. Decision-making details documented in ED Course.  Risk OTC drugs.    1st MTP pain while walking on treadmill. No trauma or fall. Swelling improved. No prior h/o gout.  No evidence of wound or infection. With improved swelling, gout unlikely. XR with mod arthritis. Symptomatic treatment recommended. PCP f/u.    Final Clinical Impression(s) / ED Diagnoses Final diagnoses:  Foot pain, right  Arthritis of big toe   The patient appears reasonably screened and/or stabilized for discharge and I doubt any other medical condition or other [facility] requiring further screening, evaluation, or treatment in the ED at this time. I have discussed the findings, Dx and Tx plan with the patient/family who expressed understanding and agree(s) with the plan. Discharge instructions discussed at length. The patient/family was given strict return precautions who verbalized  understanding of the instructions. No further questions at time of discharge.  Disposition: Discharge  Condition: Good  ED Discharge Orders     None        Follow Up: Tommi Rumps, MD [id number] [place] [address] [phone]  Call  to schedule an appointment for close follow up    This chart was dictated using voice recognition software.  Despite best efforts to proofread,  errors can occur which can change the documentation meaning.    Nira Conn, MD 04/29/23 [phone]

## 2023-04-29 NOTE — ED Triage Notes (Signed)
 Pt reports walking on the treadmill about 2 hrs ago when she started feeling tingling/burning in right foot. Mild swelling noted. Sensation and pulses intact.

## 2023-05-12 DIAGNOSIS — E119 Type 2 diabetes mellitus without complications: Secondary | ICD-10-CM | POA: Insufficient documentation

## 2023-05-12 DIAGNOSIS — Z85118 Personal history of other malignant neoplasm of bronchus and lung: Secondary | ICD-10-CM | POA: Insufficient documentation

## 2023-05-12 DIAGNOSIS — Z7901 Long term (current) use of anticoagulants: Secondary | ICD-10-CM | POA: Diagnosis not present

## 2023-05-12 DIAGNOSIS — I10 Essential (primary) hypertension: Secondary | ICD-10-CM | POA: Diagnosis not present

## 2023-05-12 DIAGNOSIS — R0602 Shortness of breath: Secondary | ICD-10-CM | POA: Diagnosis present

## 2023-05-12 DIAGNOSIS — J441 Chronic obstructive pulmonary disease with (acute) exacerbation: Secondary | ICD-10-CM | POA: Diagnosis not present

## 2023-05-12 DIAGNOSIS — Z7951 Long term (current) use of inhaled steroids: Secondary | ICD-10-CM | POA: Diagnosis not present

## 2023-05-12 DIAGNOSIS — F17210 Nicotine dependence, cigarettes, uncomplicated: Secondary | ICD-10-CM | POA: Insufficient documentation

## 2023-05-13 ENCOUNTER — Emergency Department (HOSPITAL_COMMUNITY)
Admission: EM | Admit: 2023-05-13 | Discharge: 2023-05-13 | Disposition: A | Discharge status: Home / Self Care | Attending: Emergency Medicine | Admitting: Emergency Medicine

## 2023-05-13 ENCOUNTER — Emergency Department (HOSPITAL_COMMUNITY)

## 2023-05-13 ENCOUNTER — Other Ambulatory Visit: Payer: Self-pay

## 2023-05-13 DIAGNOSIS — J111 Influenza due to unidentified influenza virus with other respiratory manifestations: Secondary | ICD-10-CM

## 2023-05-13 LAB — RESP PANEL BY RT-PCR (RSV, FLU A&B, COVID)  RVPGX2
Influenza A by PCR: NEGATIVE
Influenza B by PCR: NEGATIVE
Resp Syncytial Virus by PCR: NEGATIVE
SARS Coronavirus 2 by RT PCR: NEGATIVE

## 2023-05-13 LAB — CBC
HCT: 38.4 % (ref 36.0–46.0)
Hemoglobin: 12.2 g/dL (ref 12.0–15.0)
MCH: 22.9 pg — ABNORMAL LOW (ref 26.0–34.0)
MCHC: 31.8 g/dL (ref 30.0–36.0)
MCV: 72.2 fL — ABNORMAL LOW (ref 80.0–100.0)
Platelets: 323 10*3/uL (ref 150–400)
RBC: 5.32 MIL/uL — ABNORMAL HIGH (ref 3.87–5.11)
RDW: 16.6 % — ABNORMAL HIGH (ref 11.5–15.5)
WBC: 6.6 10*3/uL (ref 4.0–10.5)
nRBC: 0 % (ref 0.0–0.2)

## 2023-05-13 LAB — BASIC METABOLIC PANEL WITH GFR
Anion gap: 9 (ref 5–15)
BUN: 9 mg/dL (ref 6–20)
CO2: 23 mmol/L (ref 22–32)
Calcium: 8.1 mg/dL — ABNORMAL LOW (ref 8.9–10.3)
Chloride: 106 mmol/L (ref 98–111)
Creatinine, Ser: 0.88 mg/dL (ref 0.44–1.00)
GFR, Estimated: >60 mL/min (ref >60)
Glucose, Bld: 188 mg/dL — ABNORMAL HIGH (ref 70–99)
Potassium: 3.2 mmol/L — ABNORMAL LOW (ref 3.5–5.1)
Sodium: 138 mmol/L (ref 135–145)

## 2023-05-13 LAB — TROPONIN I (HIGH SENSITIVITY)
Troponin I (High Sensitivity): 4 ng/L (ref <18)
Troponin I (High Sensitivity): 6 ng/L (ref <18)

## 2023-05-13 NOTE — Discharge Instructions (Addendum)
 Symptoms seem to be consistent with a viral upper respiratory infection.  COVID flu RSV testing has been done.  Would recommend over-the-counter Mucinex DM extra strength Tylenol  as needed.  Do not recommend any treatment for the slight fluid in the ankles at this time until this illness is over.  Chest x-ray here today was negative cardiac enzymes troponins were negative labs were negative renal function was normal.  No evidence of pneumonia on the chest x-ray.  Would recommend follow-up with primary care doctor in the next few days.

## 2023-05-13 NOTE — ED Provider Notes (Signed)
 [place] EMERGENCY DEPARTMENT AT [facility] Provider Note   CSN: [id number] Arrival date & time: 05/12/23  2344     History  Chief Complaint  Patient presents with   Chest Pain    Ashley [name] is a 58 y.o. female.  Patient originally triaged with concerns for chest pain shortness of breath.  But patient really has more upper respiratory type symptoms sore throat productive cough for the past week.  Patient is also concerned about some slight swelling in the in the ankles.  Medical history significant for hypertension.  Patient's vital signs here temp 98.5 pulse 63 respirations 14 blood pressure 108/50 oxygen saturation is 98%.       Home Medications Prior to Admission medications   Medication Sig Start Date End Date Taking? Authorizing Provider  albuterol  (VENTOLIN  HFA) 108 (90 Base) MCG/ACT inhaler Inhale 2 puffs into the lungs every 6 (six) hours as needed for up to 10 days for wheezing or shortness of breath. 02/23/23 03/05/23  Acevedo, Eleanna, PA  Ascorbic Acid (VITAMIN C PO) Take 1 tablet by mouth daily.    [provider]  atenolol (TENORMIN) 25 MG tablet Take 25 mg by mouth at bedtime. 07/05/20   [provider]  atorvastatin  (LIPITOR) 40 MG tablet Take 1 tablet (40 mg total) by mouth daily. 09/14/20   Aura Leeds Latif, DO  cetirizine  (ZYRTEC ) 10 MG chewable tablet Chew 1 tablet (10 mg total) by mouth daily. 11/26/22   Mayer, Jodi R, NP  Cyanocobalamin (VITAMIN B-12 PO) Take 1 tablet by mouth daily.    [provider]  diclofenac  Sodium (VOLTAREN ) 1 % GEL Apply 4 g topically 4 (four) times daily. Apply to affected areas 4 times daily as needed for pain. 09/19/21   Eloise Hake Scales, PA-C  DULoxetine (CYMBALTA) 20 MG capsule Take 20 mg by mouth daily.    [provider]  esomeprazole (NEXIUM) 40 MG capsule Take 40 mg by mouth daily before breakfast.    [provider]  fluticasone  (FLONASE ) 50 MCG/ACT nasal spray  Place 1 spray into both nostrils daily. 11/26/22   Mayer, Jodi R, NP  gabapentin (NEURONTIN) 300 MG capsule Take 300 mg by mouth 2 (two) times daily.    [provider]  hydrochlorothiazide (HYDRODIURIL) 25 MG tablet Take 25 mg by mouth daily. 09/11/20   [provider]  Levetiracetam 750 MG TB24 Take 750 mg by mouth in the morning and at bedtime. 09/06/20   [provider]  losartan  (COZAAR ) 100 MG tablet Take 1 tablet (100 mg total) by mouth daily. 09/15/20 10/15/20  Aura Leeds Latif, DO  metFORMIN  (GLUCOPHAGE -XR) 500 MG 24 hr tablet Take 1,000 mg by mouth every morning. 06/18/20   [provider]  nitroGLYCERIN  (NITROSTAT ) 0.4 MG SL tablet Place 1 tablet (0.4 mg total) under the tongue every 5 (five) minutes as needed for chest pain. 09/14/20   Sheikh, Omair Latif, DO  senna-docusate (SENOKOT-S) 8.6-50 MG tablet Take 1 tablet by mouth at bedtime as needed for mild constipation. 09/14/20   Sheikh, Omair Latif, DO  VITAMIN A PO Take 1 tablet by mouth daily.    [provider]  VITAMIN E PO Take 1 tablet by mouth daily.    [provider]  zolmitriptan (ZOMIG) 5 MG tablet Take 5 mg by mouth daily as needed for migraine.    [provider]      Allergies    Penicillins    Review of Systems  Review of Systems  Constitutional:  Negative for chills and fever.  HENT:  Positive for congestion. Negative for ear pain and sore throat.   Eyes:  Negative for pain and visual disturbance.  Respiratory:  Positive for cough and shortness of breath.   Cardiovascular:  Positive for chest pain and leg swelling. Negative for palpitations.  Gastrointestinal:  Negative for abdominal pain and vomiting.  Genitourinary:  Negative for dysuria and hematuria.  Musculoskeletal:  Negative for arthralgias and back pain.  Skin:  Negative for color change and rash.  Neurological:  Negative for seizures and syncope.  All other systems reviewed and are  negative.   Physical Exam Updated Vital Signs BP (!) 108/50   Pulse 63   Temp 98.5 F (36.9 C)   Resp 14   LMP 07/13/2011   SpO2 98%  Physical Exam Vitals and nursing note reviewed.  Constitutional:      General: She is not in acute distress.    Appearance: Normal appearance. She is well-developed.  HENT:     Head: Normocephalic and atraumatic.     Mouth/Throat:     Mouth: Mucous membranes are moist.     Pharynx: No oropharyngeal exudate or posterior oropharyngeal erythema.     Comments: Uvula is midline. Eyes:     Extraocular Movements: Extraocular movements intact.     Conjunctiva/sclera: Conjunctivae normal.     Pupils: Pupils are equal, round, and reactive to light.  Cardiovascular:     Rate and Rhythm: Normal rate and regular rhythm.     Heart sounds: No murmur heard. Pulmonary:     Effort: Pulmonary effort is normal. No respiratory distress.     Breath sounds: Normal breath sounds. No stridor. No wheezing, rhonchi or rales.  Abdominal:     Palpations: Abdomen is soft.     Tenderness: There is no abdominal tenderness.  Musculoskeletal:        General: No swelling.     Cervical back: Normal range of motion and neck supple.     Comments: Minimal swelling to bilateral ankles.  Skin:    General: Skin is warm and dry.     Capillary Refill: Capillary refill takes less than 2 seconds.  Neurological:     General: No focal deficit present.     Mental Status: She is alert and oriented to person, place, and time.  Psychiatric:        Mood and Affect: Mood normal.     ED Results / Procedures / Treatments   Labs (all labs ordered are listed, but only abnormal results are displayed) Labs Reviewed  BASIC METABOLIC PANEL WITH GFR - Abnormal; Notable for the following components:      Result Value   Potassium 3.2 (*)    Glucose, Bld 188 (*)    Calcium  8.1 (*)    All other components within normal limits  CBC - Abnormal; Notable for the following components:   RBC  5.32 (*)    MCV 72.2 (*)    MCH 22.9 (*)    RDW 16.6 (*)    All other components within normal limits  RESP PANEL BY RT-PCR (RSV, FLU A&B, COVID)  RVPGX2  TROPONIN I (HIGH SENSITIVITY)  TROPONIN I (HIGH SENSITIVITY)    EKG EKG Interpretation Date/Time:  Wednesday May 13 2023 01:08:54 EDT Ventricular Rate:  68 PR Interval:  152 QRS Duration:  86 QT Interval:  428 QTC Calculation: 455 R Axis:   59  Text Interpretation: Normal sinus rhythm  Normal ECG No significant change since last tracing Confirmed by Kelechi Astarita [id number]) on 05/13/2023 9:14:03 AM  Radiology DG Chest 2 View Result Date: 05/13/2023 CLINICAL DATA:  Chest pain EXAM: CHEST - 2 VIEW COMPARISON:  04/11/2023 FINDINGS: The heart size and mediastinal contours are within normal limits. Both lungs are clear. The visualized skeletal structures are unremarkable. IMPRESSION: No active cardiopulmonary disease. Electronically Signed   By: Esmeralda Hedge M.D.   On: 05/13/2023 00:55    Procedures Procedures    Medications Ordered in ED Medications - No data to display  ED Course/ Medical Decision Making/ A&P                                 Medical Decision Making Amount and/or Complexity of Data Reviewed Labs: ordered. Radiology: ordered.   Patient's troponins x 2 are without any acute findings.  Basic metabolic panel potassium down little bit at 3.2 renal function normal.  Electrolytes normal.  Blood sugar 188 white count 6.6 hemoglobin 12.2 platelets 323.  Chest x-ray negative for any acute findings.  EKG had no acute findings.  Essentially no change from previous.  Patient's symptoms are consistent with upper respiratory flulike illness.  Recommend symptomatic treatment.  But do recommend that we do testing for COVID flu and RSV.  Although out of the window for any antivirals.  Would recommend over-the-counter Mucinex DM.  Would not recommend any diuretic at this time for the slight ankle swelling.  Since main  symptoms are the persistent cough. Final Clinical Impression(s) / ED Diagnoses Final diagnoses:  Influenza-like illness    Rx / DC Orders ED Discharge Orders     None         Nicklas Barns, MD 05/13/23 [phone]

## 2023-05-13 NOTE — ED Triage Notes (Signed)
 Pt reports centralized sharp chest pain associated with SOB. Pain radiates to right shoulder and back. Reports s/s improve with rest. Also sts concern for bilateral numbing pain - worse on the right sensation and pulses intact bilaterally

## 2023-05-16 ENCOUNTER — Emergency Department (HOSPITAL_COMMUNITY)
Admission: EM | Admit: 2023-05-16 | Discharge: 2023-05-17 | Disposition: A | Discharge status: Home / Self Care | Attending: Emergency Medicine | Admitting: Emergency Medicine

## 2023-05-16 ENCOUNTER — Encounter (HOSPITAL_COMMUNITY): Payer: Self-pay

## 2023-05-16 ENCOUNTER — Other Ambulatory Visit: Payer: Self-pay

## 2023-05-16 DIAGNOSIS — I10 Essential (primary) hypertension: Secondary | ICD-10-CM | POA: Insufficient documentation

## 2023-05-16 DIAGNOSIS — Z79899 Other long term (current) drug therapy: Secondary | ICD-10-CM | POA: Diagnosis not present

## 2023-05-16 DIAGNOSIS — R2243 Localized swelling, mass and lump, lower limb, bilateral: Secondary | ICD-10-CM | POA: Diagnosis present

## 2023-05-16 DIAGNOSIS — R7303 Prediabetes: Secondary | ICD-10-CM | POA: Diagnosis not present

## 2023-05-16 DIAGNOSIS — M2540 Effusion, unspecified joint: Secondary | ICD-10-CM

## 2023-05-16 DIAGNOSIS — R0981 Nasal congestion: Secondary | ICD-10-CM | POA: Diagnosis present

## 2023-05-16 DIAGNOSIS — Z7984 Long term (current) use of oral hypoglycemic drugs: Secondary | ICD-10-CM | POA: Insufficient documentation

## 2023-05-16 LAB — CBC WITH DIFFERENTIAL/PLATELET
Abs Immature Granulocytes: 0.02 10*3/uL (ref 0.00–0.07)
Basophils Absolute: 0.1 10*3/uL (ref 0.0–0.1)
Basophils Relative: 1 %
Eosinophils Absolute: 0.1 10*3/uL (ref 0.0–0.5)
Eosinophils Relative: 2 %
HCT: 36.3 % (ref 36.0–46.0)
Hemoglobin: 11.7 g/dL — ABNORMAL LOW (ref 12.0–15.0)
Immature Granulocytes: 0 %
Lymphocytes Relative: 44 %
Lymphs Abs: 3.4 10*3/uL (ref 0.7–4.0)
MCH: 23.4 pg — ABNORMAL LOW (ref 26.0–34.0)
MCHC: 32.2 g/dL (ref 30.0–36.0)
MCV: 72.7 fL — ABNORMAL LOW (ref 80.0–100.0)
Monocytes Absolute: 0.5 10*3/uL (ref 0.1–1.0)
Monocytes Relative: 6 %
Neutro Abs: 3.7 10*3/uL (ref 1.7–7.7)
Neutrophils Relative %: 47 %
Platelets: 323 10*3/uL (ref 150–400)
RBC: 4.99 MIL/uL (ref 3.87–5.11)
RDW: 16.3 % — ABNORMAL HIGH (ref 11.5–15.5)
WBC: 7.8 10*3/uL (ref 4.0–10.5)
nRBC: 0 % (ref 0.0–0.2)

## 2023-05-16 LAB — COMPREHENSIVE METABOLIC PANEL WITH GFR
ALT: 23 U/L (ref 0–44)
AST: 31 U/L (ref 15–41)
Albumin: 3.3 g/dL — ABNORMAL LOW (ref 3.5–5.0)
Alkaline Phosphatase: 114 U/L (ref 38–126)
Anion gap: 6 (ref 5–15)
BUN: 11 mg/dL (ref 6–20)
CO2: 21 mmol/L — ABNORMAL LOW (ref 22–32)
Calcium: 8.4 mg/dL — ABNORMAL LOW (ref 8.9–10.3)
Chloride: 110 mmol/L (ref 98–111)
Creatinine, Ser: 0.71 mg/dL (ref 0.44–1.00)
GFR, Estimated: >60 mL/min (ref >60)
Glucose, Bld: 143 mg/dL — ABNORMAL HIGH (ref 70–99)
Potassium: 3.3 mmol/L — ABNORMAL LOW (ref 3.5–5.1)
Sodium: 137 mmol/L (ref 135–145)
Total Bilirubin: 0.6 mg/dL (ref 0.0–1.2)
Total Protein: 6.3 g/dL — ABNORMAL LOW (ref 6.5–8.1)

## 2023-05-16 LAB — RESP PANEL BY RT-PCR (RSV, FLU A&B, COVID)  RVPGX2
Influenza A by PCR: NEGATIVE
Influenza B by PCR: NEGATIVE
Resp Syncytial Virus by PCR: NEGATIVE
SARS Coronavirus 2 by RT PCR: NEGATIVE

## 2023-05-16 MED ORDER — PREDNISONE 10 MG PO TABS: 40.0000 mg | ORAL_TABLET | Freq: Every day | ORAL | 0 refills | Status: DC

## 2023-05-16 MED ORDER — PREDNISONE 20 MG PO TABS
40.0000 mg | ORAL_TABLET | Freq: Once | ORAL | Status: AC
  Administered 2023-05-17: 40 mg via ORAL
  Filled 2023-05-16: qty 2

## 2023-05-16 MED ORDER — DOXYCYCLINE HYCLATE 100 MG PO TABS
100.0000 mg | ORAL_TABLET | Freq: Once | ORAL | Status: AC
  Administered 2023-05-17: 100 mg via ORAL
  Filled 2023-05-16: qty 1

## 2023-05-16 MED ORDER — DOXYCYCLINE HYCLATE 100 MG PO CAPS: 100.0000 mg | ORAL_CAPSULE | Freq: Two times a day (BID) | ORAL | 0 refills | Status: DC

## 2023-05-16 NOTE — ED Provider Notes (Signed)
 [place] EMERGENCY DEPARTMENT AT [facility] Provider Note   CSN: [id number] Arrival date & time: 05/16/23  2123     History {Add pertinent medical, surgical, social history, OB history to HPI:1} Chief Complaint  Patient presents with   Leg Swelling    MISHAY LUCKS is a 58 y.o. female.  The history is provided by the patient.   KIYO TIFFIN is a 58 y.o. female who presents to the Emergency Department complaining of *** Bilateral ankle swelling this morning, right greater than left.  No fever, chest pain, sob No injuries  Hx/o HTN, preDM.  No hx/o gout, DVT/PE.     Home Medications Prior to Admission medications   Medication Sig Start Date End Date Taking? Authorizing Provider  albuterol  (VENTOLIN  HFA) 108 (90 Base) MCG/ACT inhaler Inhale 2 puffs into the lungs every 6 (six) hours as needed for up to 10 days for wheezing or shortness of breath. 02/23/23 03/05/23  Acevedo, Sheza, PA  Ascorbic Acid (VITAMIN C PO) Take 1 tablet by mouth daily.    [provider]  atenolol (TENORMIN) 25 MG tablet Take 25 mg by mouth at bedtime. 07/05/20   [provider]  atorvastatin  (LIPITOR) 40 MG tablet Take 1 tablet (40 mg total) by mouth daily. 09/14/20   Aura Leeds Latif, DO  cetirizine  (ZYRTEC ) 10 MG chewable tablet Chew 1 tablet (10 mg total) by mouth daily. 11/26/22   Mayer, Jodi R, NP  Cyanocobalamin (VITAMIN B-12 PO) Take 1 tablet by mouth daily.    [provider]  diclofenac  Sodium (VOLTAREN ) 1 % GEL Apply 4 g topically 4 (four) times daily. Apply to affected areas 4 times daily as needed for pain. 09/19/21   Eloise Hake Scales, PA-C  DULoxetine (CYMBALTA) 20 MG capsule Take 20 mg by mouth daily.    [provider]  esomeprazole (NEXIUM) 40 MG capsule Take 40 mg by mouth daily before breakfast.    [provider]  fluticasone  (FLONASE ) 50 MCG/ACT nasal spray Place 1 spray into both nostrils daily. 11/26/22   Mayer, Jodi  R, NP  gabapentin (NEURONTIN) 300 MG capsule Take 300 mg by mouth 2 (two) times daily.    [provider]  hydrochlorothiazide (HYDRODIURIL) 25 MG tablet Take 25 mg by mouth daily. 09/11/20   [provider]  Levetiracetam 750 MG TB24 Take 750 mg by mouth in the morning and at bedtime. 09/06/20   [provider]  losartan  (COZAAR ) 100 MG tablet Take 1 tablet (100 mg total) by mouth daily. 09/15/20 10/15/20  Aura Leeds Latif, DO  metFORMIN  (GLUCOPHAGE -XR) 500 MG 24 hr tablet Take 1,000 mg by mouth every morning. 06/18/20   [provider]  nitroGLYCERIN  (NITROSTAT ) 0.4 MG SL tablet Place 1 tablet (0.4 mg total) under the tongue every 5 (five) minutes as needed for chest pain. 09/14/20   Sheikh, Omair Latif, DO  senna-docusate (SENOKOT-S) 8.6-50 MG tablet Take 1 tablet by mouth at bedtime as needed for mild constipation. 09/14/20   Sheikh, Omair Latif, DO  VITAMIN A PO Take 1 tablet by mouth daily.    [provider]  VITAMIN E PO Take 1 tablet by mouth daily.    [provider]  zolmitriptan (ZOMIG) 5 MG tablet Take 5 mg by mouth daily as needed for migraine.    [provider]      Allergies    Penicillins    Review of Systems   Review of Systems  Physical Exam  Updated Vital Signs BP 134/69 (BP Location: Right Arm)   Pulse 69   Temp 97.9 F (36.6 C) (Oral)   Resp 16   Ht 5\' 7"  (1.702 m)   Wt 91.6 kg   LMP 07/13/2011   SpO2 95%   BMI 31.64 kg/m  Physical Exam  ED Results / Procedures / Treatments   Labs (all labs ordered are listed, but only abnormal results are displayed) Labs Reviewed  CBC WITH DIFFERENTIAL/PLATELET - Abnormal; Notable for the following components:      Result Value   Hemoglobin 11.7 (*)    MCV 72.7 (*)    MCH 23.4 (*)    RDW 16.3 (*)    All other components within normal limits  COMPREHENSIVE METABOLIC PANEL WITH GFR - Abnormal; Notable for the following components:   Potassium 3.3 (*)    CO2  21 (*)    Glucose, Bld 143 (*)    Calcium  8.4 (*)    Total Protein 6.3 (*)    Albumin 3.3 (*)    All other components within normal limits  RESP PANEL BY RT-PCR (RSV, FLU A&B, COVID)  RVPGX2    EKG None  Radiology No results found.  Procedures Procedures  {Document cardiac monitor, telemetry assessment procedure when appropriate:1}  Medications Ordered in ED Medications - No data to display  ED Course/ Medical Decision Making/ A&P   {   Click here for ABCD2, HEART and other calculatorsREFRESH Note before signing :1}                              Medical Decision Making Amount and/or Complexity of Data Reviewed Labs: ordered.   ***  {Document critical care time when appropriate:1} {Document review of labs and clinical decision tools ie heart score, Chads2Vasc2 etc:1}  {Document your independent review of radiology images, and any outside records:1} {Document your discussion with family members, caretakers, and with consultants:1} {Document social determinants of health affecting pt's care:1} {Document your decision making why or why not admission, treatments were needed:1} Final Clinical Impression(s) / ED Diagnoses Final diagnoses:  None    Rx / DC Orders ED Discharge Orders     None

## 2023-05-16 NOTE — ED Triage Notes (Signed)
 Pt with RLE swelling that she noticed this am around 1100 with pain and spasm with radiation to knee. No cardiac or hx of blood clots to Pt's knowledge. Endorses pain when bearing wt to RLE as well.

## 2023-05-16 NOTE — ED Notes (Signed)
 Patient reports waking up with bilateral lower leg swelling, soaked feet in Epson salt and swelling has decreased. Generalized edema noted in the right lower leg. Pedal Pulses palpable.

## 2023-05-17 ENCOUNTER — Other Ambulatory Visit: Payer: Self-pay

## 2023-05-17 ENCOUNTER — Emergency Department (HOSPITAL_COMMUNITY)
Admission: EM | Admit: 2023-05-17 | Discharge: 2023-05-18 | Disposition: A | Discharge status: Home / Self Care | Attending: Emergency Medicine | Admitting: Emergency Medicine

## 2023-05-17 ENCOUNTER — Encounter (HOSPITAL_COMMUNITY): Payer: Self-pay | Admitting: Emergency Medicine

## 2023-05-17 DIAGNOSIS — Z79899 Other long term (current) drug therapy: Secondary | ICD-10-CM | POA: Insufficient documentation

## 2023-05-17 DIAGNOSIS — I10 Essential (primary) hypertension: Secondary | ICD-10-CM | POA: Insufficient documentation

## 2023-05-17 DIAGNOSIS — R0981 Nasal congestion: Secondary | ICD-10-CM | POA: Insufficient documentation

## 2023-05-17 NOTE — ED Notes (Signed)
 EKG on arrival delayed due to patient continuously going through multiple personal bags looking for "my cell phone."  During triage, patient continued to search bags

## 2023-05-17 NOTE — ED Triage Notes (Signed)
 Patient coming to ED for evaluation of "I can't breath."  Reports nasal congestion.  Recently seen at [facility] and started on Prednisone and Doxycycline for ankle/feet swelling and pain.  Concerned that medications might have caused nasal congestion.  Having chest pain from the nasal congestion and "its making my heart fill like it is speeding up and slowing down."

## 2023-05-18 NOTE — Discharge Instructions (Signed)

## 2023-05-18 NOTE — ED Provider Notes (Signed)
 [place] EMERGENCY DEPARTMENT AT [facility] Provider Note   CSN: [id number] Arrival date & time: 05/17/23  2213     History  Chief Complaint  Patient presents with   Nasal Congestion    Ashley [name] is a 58 y.o. female.  HPI   Patient w/history of anemia, GERD, hypertension presents for her seventh ER visit in 6 months.  Patient reports she has had nasal congestion of the past few days, when she takes a deep breath makes it feel like her heart is racing and she has chest pain.  She suspect this is all due to nasal congestion.  No fevers or vomiting.  No hemoptysis. No known history of CAD/VTE Past Medical History:  Diagnosis Date   Anemia    Brain tumor (benign) (HCC)    has a tumor watching-no surgery=no tx    Eczema    GERD (gastroesophageal reflux disease)    Headache(784.0)    Heart murmur    dx as a child, no probems as adult   Hypertension    SVD (spontaneous vaginal delivery)    x 2    Home Medications Prior to Admission medications   Medication Sig Start Date End Date Taking? Authorizing Provider  albuterol  (VENTOLIN  HFA) 108 (90 Base) MCG/ACT inhaler Inhale 2 puffs into the lungs every 6 (six) hours as needed for up to 10 days for wheezing or shortness of breath. 02/23/23 03/05/23  Acevedo, Desma, PA  Ascorbic Acid (VITAMIN C PO) Take 1 tablet by mouth daily.    [provider]  atenolol (TENORMIN) 25 MG tablet Take 25 mg by mouth at bedtime. 07/05/20   [provider]  atorvastatin  (LIPITOR) 40 MG tablet Take 1 tablet (40 mg total) by mouth daily. 09/14/20   Aura Leeds Latif, DO  cetirizine  (ZYRTEC ) 10 MG chewable tablet Chew 1 tablet (10 mg total) by mouth daily. 11/26/22   Mayer, Jodi R, NP  Cyanocobalamin (VITAMIN B-12 PO) Take 1 tablet by mouth daily.    [provider]  diclofenac  Sodium (VOLTAREN ) 1 % GEL Apply 4 g topically 4 (four) times daily. Apply to affected areas 4 times daily as needed for pain. 09/19/21    Eloise Hake Scales, PA-C  doxycycline (VIBRAMYCIN) 100 MG capsule Take 1 capsule (100 mg total) by mouth 2 (two) times daily. 05/16/23   Kelsey Patricia, MD  DULoxetine (CYMBALTA) 20 MG capsule Take 20 mg by mouth daily.    [provider]  esomeprazole (NEXIUM) 40 MG capsule Take 40 mg by mouth daily before breakfast.    [provider]  fluticasone  (FLONASE ) 50 MCG/ACT nasal spray Place 1 spray into both nostrils daily. 11/26/22   Mayer, Jodi R, NP  gabapentin (NEURONTIN) 300 MG capsule Take 300 mg by mouth 2 (two) times daily.    [provider]  hydrochlorothiazide (HYDRODIURIL) 25 MG tablet Take 25 mg by mouth daily. 09/11/20   [provider]  Levetiracetam 750 MG TB24 Take 750 mg by mouth in the morning and at bedtime. 09/06/20   [provider]  losartan  (COZAAR ) 100 MG tablet Take 1 tablet (100 mg total) by mouth daily. 09/15/20 10/15/20  Aura Leeds Latif, DO  metFORMIN  (GLUCOPHAGE -XR) 500 MG 24 hr tablet Take 1,000 mg by mouth every morning. 06/18/20   [provider]  nitroGLYCERIN  (NITROSTAT ) 0.4 MG SL tablet Place 1 tablet (0.4 mg total) under the tongue every 5 (five) minutes as needed for chest pain. 09/14/20   Gillermo Lack,  Omair Latif, DO  predniSONE (DELTASONE) 10 MG tablet Take 4 tablets (40 mg total) by mouth daily. 05/16/23   Kelsey Patricia, MD  senna-docusate (SENOKOT-S) 8.6-50 MG tablet Take 1 tablet by mouth at bedtime as needed for mild constipation. 09/14/20   Sheikh, Omair Latif, DO  VITAMIN A PO Take 1 tablet by mouth daily.    [provider]  VITAMIN E PO Take 1 tablet by mouth daily.    [provider]  zolmitriptan (ZOMIG) 5 MG tablet Take 5 mg by mouth daily as needed for migraine.    [provider]      Allergies    Penicillins    Review of Systems   Review of Systems  Constitutional:  Negative for fever.  Gastrointestinal:  Negative for vomiting.    Physical Exam Updated Vital  Signs BP (!) 177/94 (BP Location: Right Arm)   Pulse 94   Temp 97.6 F (36.4 C)   Resp 18   Ht 1.702 m (5\' 7" )   Wt 91.6 kg   LMP 07/13/2011   SpO2 100%   BMI 31.64 kg/m  Physical Exam CONSTITUTIONAL: Sleeping soundly on arrival in no acute distress HEAD: Normocephalic/atraumatic EYES: EOMI/PERRL ENMT: Mucous membranes moist, no stridor, no drooling no facial tenderness or swelling NECK: supple no meningeal signs CV: S1/S2 noted, no murmurs/rubs/gallops noted LUNGS: Lungs are clear to auscultation bilaterally, no apparent distress ABDOMEN: soft NEURO: Pt is sleeping but easily arousable, moves all extremities x 4 EXTREMITIES: pulses normal/equal, full ROM SKIN: warm, color normal  ED Results / Procedures / Treatments   Labs (all labs ordered are listed, but only abnormal results are displayed) Labs Reviewed - No data to display  EKG EKG Interpretation Date/Time:  Sunday May 17 2023 22:36:27 EDT Ventricular Rate:  78 PR Interval:  152 QRS Duration:  80 QT Interval:  398 QTC Calculation: 453 R Axis:   57  Text Interpretation: Normal sinus rhythm Nonspecific T wave abnormality Abnormal ECG Confirmed by Eldon Greenland ([id number]) on 05/18/2023 1:00:42 AM  Radiology No results found.  Procedures Procedures    Medications Ordered in ED Medications - No data to display  ED Course/ Medical Decision Making/ A&P                                 Medical Decision Making  Patient presents for her seventh ER visit in 6 months.  She now reports has had nasal congestion that makes it feel like her heart is racing.  Patient falls asleep throughout the exam and is in no acute distress.  EKG is overall unremarkable.  Will defer further workup for now Safe for discharge        Final Clinical Impression(s) / ED Diagnoses Final diagnoses:  Nasal congestion    Rx / DC Orders ED Discharge Orders     None         Eldon Greenland, MD 05/18/23 0134

## 2023-05-19 ENCOUNTER — Emergency Department (HOSPITAL_COMMUNITY)
Admission: EM | Admit: 2023-05-19 | Discharge: 2023-05-20 | Disposition: A | Discharge status: Home / Self Care | Attending: Emergency Medicine | Admitting: Emergency Medicine

## 2023-05-19 ENCOUNTER — Encounter (HOSPITAL_COMMUNITY): Payer: Self-pay | Admitting: *Deleted

## 2023-05-19 ENCOUNTER — Other Ambulatory Visit: Payer: Self-pay

## 2023-05-19 DIAGNOSIS — Y9301 Activity, walking, marching and hiking: Secondary | ICD-10-CM | POA: Insufficient documentation

## 2023-05-19 DIAGNOSIS — W01198A Fall on same level from slipping, tripping and stumbling with subsequent striking against other object, initial encounter: Secondary | ICD-10-CM | POA: Diagnosis not present

## 2023-05-19 DIAGNOSIS — E119 Type 2 diabetes mellitus without complications: Secondary | ICD-10-CM | POA: Insufficient documentation

## 2023-05-19 DIAGNOSIS — W19XXXA Unspecified fall, initial encounter: Secondary | ICD-10-CM

## 2023-05-19 DIAGNOSIS — M545 Low back pain, unspecified: Secondary | ICD-10-CM | POA: Diagnosis present

## 2023-05-19 DIAGNOSIS — Z7984 Long term (current) use of oral hypoglycemic drugs: Secondary | ICD-10-CM | POA: Diagnosis not present

## 2023-05-19 NOTE — ED Triage Notes (Signed)
 Pt c/o back pain after falling when walking on a treadmill. Reports tripping at that time. Able to ambulate after the fall

## 2023-05-20 ENCOUNTER — Emergency Department (HOSPITAL_COMMUNITY)

## 2023-05-20 MED ORDER — LIDOCAINE 5 % EX PTCH: 1.0000 | MEDICATED_PATCH | CUTANEOUS | 0 refills | Status: AC

## 2023-05-20 MED ORDER — ACETAMINOPHEN 325 MG PO TABS
650.0000 mg | ORAL_TABLET | Freq: Once | ORAL | Status: AC
  Administered 2023-05-20: 650 mg via ORAL
  Filled 2023-05-20: qty 2

## 2023-05-20 MED ORDER — LIDOCAINE 5 % EX PTCH
1.0000 | MEDICATED_PATCH | CUTANEOUS | Status: DC
  Administered 2023-05-20: 1 via TRANSDERMAL
  Filled 2023-05-20: qty 1

## 2023-05-20 NOTE — ED Provider Notes (Signed)
 Patient given in sign out by [place], PA-C.  Please review their note for patient HPI, physical exam, workup.  At this time the plan is follow-up on imaging and plan on discharge.  X-rays are negative.  Patient states has been out to ambulate since the fall and so I have a low suspicion of occult hip fracture.  Patient states he feels better after Tylenol  and so at this time do feel she has a contusion from her fall and will have her follow-up with her primary care provider.  Will give her a lidocaine  patch here and prescribe lidocaine  patches as well.  I did recommend Tylenol  every 6 hours needed for pain along with ice and rest to which patient agrees.  Patient denies any saddle anesthesia, urinary or bowel incontinence or other red flag symptoms of necessitate further workup for her low back pain.  Patient verbalized understanding and agreement of this plan.  Patient stable to be discharged.  Patient given return precautions.   Denese Finn, PA-C 05/20/23 [id number]    Alissa April, MD 05/20/23 [phone]

## 2023-05-20 NOTE — ED Notes (Signed)
 Patient reports that she fell onto her butt while on a tredmill at gym. Denies LOC reports all over back pain and neck pain. Patient is able to ambulate

## 2023-05-20 NOTE — ED Provider Notes (Signed)
 [place] EMERGENCY DEPARTMENT AT [facility] Provider Note   CSN: [id number] Arrival date & time: 05/19/23  2248     History  Chief Complaint  Patient presents with   Back Pain    Ashley [name] is a 58 y.o. female.  Patient presents the Emergency Department complaining of low back pain.  She states she was walking on a treadmill at approximately 10 PM when she slipped and fell off the back of the treadmill landing on her buttocks.  She complains of pain in the low back which radiates across the low back and also in the sacral region.  Past medical history significant for type II DM   Back Pain      Home Medications Prior to Admission medications   Medication Sig Start Date End Date Taking? Authorizing Provider  albuterol  (VENTOLIN  HFA) 108 (90 Base) MCG/ACT inhaler Inhale 2 puffs into the lungs every 6 (six) hours as needed for up to 10 days for wheezing or shortness of breath. 02/23/23 03/05/23  Acevedo, [place]ena, PA  Ascorbic Acid (VITAMIN C PO) Take 1 tablet by mouth daily.    [provider]  atenolol (TENORMIN) 25 MG tablet Take 25 mg by mouth at bedtime. 07/05/20   [provider]  atorvastatin  (LIPITOR) 40 MG tablet Take 1 tablet (40 mg total) by mouth daily. 09/14/20   Aura Leeds Latif, DO  cetirizine  (ZYRTEC ) 10 MG chewable tablet Chew 1 tablet (10 mg total) by mouth daily. 11/26/22   Mayer, Jodi R, NP  Cyanocobalamin (VITAMIN B-12 PO) Take 1 tablet by mouth daily.    [provider]  diclofenac  Sodium (VOLTAREN ) 1 % GEL Apply 4 g topically 4 (four) times daily. Apply to affected areas 4 times daily as needed for pain. 09/19/21   Eloise Hake Scales, PA-C  doxycycline (VIBRAMYCIN) 100 MG capsule Take 1 capsule (100 mg total) by mouth 2 (two) times daily. 05/16/23   Kelsey Patricia, MD  DULoxetine (CYMBALTA) 20 MG capsule Take 20 mg by mouth daily.    [provider]  esomeprazole (NEXIUM) 40 MG capsule Take 40 mg by mouth  daily before breakfast.    [provider]  fluticasone  (FLONASE ) 50 MCG/ACT nasal spray Place 1 spray into both nostrils daily. 11/26/22   Mayer, Jodi R, NP  gabapentin (NEURONTIN) 300 MG capsule Take 300 mg by mouth 2 (two) times daily.    [provider]  hydrochlorothiazide (HYDRODIURIL) 25 MG tablet Take 25 mg by mouth daily. 09/11/20   [provider]  Levetiracetam 750 MG TB24 Take 750 mg by mouth in the morning and at bedtime. 09/06/20   [provider]  losartan  (COZAAR ) 100 MG tablet Take 1 tablet (100 mg total) by mouth daily. 09/15/20 10/15/20  Aura Leeds Latif, DO  metFORMIN  (GLUCOPHAGE -XR) 500 MG 24 hr tablet Take 1,000 mg by mouth every morning. 06/18/20   [provider]  nitroGLYCERIN  (NITROSTAT ) 0.4 MG SL tablet Place 1 tablet (0.4 mg total) under the tongue every 5 (five) minutes as needed for chest pain. 09/14/20   Sheikh, Omair Latif, DO  predniSONE (DELTASONE) 10 MG tablet Take 4 tablets (40 mg total) by mouth daily. 05/16/23   Kelsey Patricia, MD  senna-docusate (SENOKOT-S) 8.6-50 MG tablet Take 1 tablet by mouth at bedtime as needed for mild constipation. 09/14/20   Sheikh, Omair Latif, DO  VITAMIN A PO Take 1 tablet by mouth daily.    [provider]  VITAMIN E PO Take  1 tablet by mouth daily.    [provider]  zolmitriptan (ZOMIG) 5 MG tablet Take 5 mg by mouth daily as needed for migraine.    [provider]      Allergies    Penicillins    Review of Systems   Review of Systems  Musculoskeletal:  Positive for back pain.    Physical Exam Updated Vital Signs BP (!) 135/119 (BP Location: Right Arm)   Pulse 65   Temp 98.5 F (36.9 C)   Resp 14   Ht 5\' 7"  (1.702 m)   Wt 91.6 kg   LMP 07/13/2011   SpO2 100%   BMI 31.64 kg/m  Physical Exam Vitals and nursing note reviewed.  Constitutional:      General: She is not in acute distress.    Appearance: She is well-developed.  HENT:     Head:  Normocephalic and atraumatic.  Eyes:     Conjunctiva/sclera: Conjunctivae normal.  Cardiovascular:     Rate and Rhythm: Normal rate and regular rhythm.  Pulmonary:     Effort: Pulmonary effort is normal. No respiratory distress.     Breath sounds: Normal breath sounds.  Abdominal:     Palpations: Abdomen is soft.     Tenderness: There is no abdominal tenderness.  Musculoskeletal:        General: Tenderness present. No swelling.     Cervical back: Neck supple.     Comments: Midline spinal tenderness in the lower lumbar/sacral region.  Skin:    General: Skin is warm and dry.     Capillary Refill: Capillary refill takes less than 2 seconds.  Neurological:     General: No focal deficit present.     Mental Status: She is alert.     Sensory: No sensory deficit.     Motor: No weakness.  Psychiatric:        Mood and Affect: Mood normal.     ED Results / Procedures / Treatments   Labs (all labs ordered are listed, but only abnormal results are displayed) Labs Reviewed - No data to display  EKG None  Radiology No results found.  Procedures Procedures    Medications Ordered in ED Medications  acetaminophen  (TYLENOL ) tablet 650 mg (has no administration in time range)    ED Course/ Medical Decision Making/ A&P                                 Medical Decision Making Amount and/or Complexity of Data Reviewed Radiology: ordered.   This patient presents to the ED for concern of back pain, this involves an extensive number of treatment options, and is a complaint that carries with it a high risk of complications and morbidity.  The differential diagnosis includes soft tissue injury, fracture, dislocation, others   Co morbidities that complicate the patient evaluation  Type II DM   Imaging Studies ordered:  I ordered imaging studies including plain films of the lumbar spine and sacrum/coccyx   Social Determinants of Health:  Patient has history of housing  insecurity   Test / Admission - Considered:  Patient care transferred to [facility], [place], PA-C. Likely discharge home after x-rays.          Final Clinical Impression(s) / ED Diagnoses Final diagnoses:  Fall, initial encounter  Acute midline low back pain without sciatica    Rx / DC Orders ED Discharge Orders  None         Delories Fetter 05/20/23 0630    Alissa April, MD 05/20/23 [phone]

## 2023-05-20 NOTE — ED Notes (Signed)
 Pt called x 2 with no answer

## 2023-05-20 NOTE — Discharge Instructions (Addendum)
 Please follow-up with your primary care provider in regards to recent ER visit. Today's physical exam and imaging was reassuring and you most likely have a benign process going on. You may rest, ice, compress, elevate your extremity and use Tylenol  every 6 hours as needed for pain. I have prescribed lidocaine  patches to help with your pain. If you begin to have decreased sensation, skin color changes, pain out of proportion/not controlled by over-the-counter medications, rigid compartments, or other worsening of symptoms please return to ER.

## 2023-05-20 NOTE — ED Notes (Signed)
 Pt found sleeping by peds

## 2023-05-20 NOTE — ED Notes (Signed)
 ED Provider at bedside.

## 2023-05-26 ENCOUNTER — Encounter (HOSPITAL_COMMUNITY): Payer: Self-pay | Admitting: Emergency Medicine

## 2023-05-26 ENCOUNTER — Other Ambulatory Visit: Payer: Self-pay

## 2023-05-26 ENCOUNTER — Emergency Department (HOSPITAL_COMMUNITY)
Admission: EM | Admit: 2023-05-26 | Discharge: 2023-05-27 | Disposition: A | Discharge status: Home / Self Care | Attending: Emergency Medicine | Admitting: Emergency Medicine

## 2023-05-26 DIAGNOSIS — M542 Cervicalgia: Secondary | ICD-10-CM | POA: Diagnosis present

## 2023-05-26 DIAGNOSIS — I773 Arterial fibromuscular dysplasia: Secondary | ICD-10-CM

## 2023-05-26 NOTE — ED Triage Notes (Signed)
 Patient reports left side neck pain and migraine after "pivoting" wrong while moving something earlier this afternoon.

## 2023-05-27 ENCOUNTER — Emergency Department (HOSPITAL_COMMUNITY)

## 2023-05-27 ENCOUNTER — Emergency Department

## 2023-05-27 ENCOUNTER — Other Ambulatory Visit: Payer: Self-pay

## 2023-05-27 ENCOUNTER — Encounter: Payer: Self-pay | Admitting: Emergency Medicine

## 2023-05-27 DIAGNOSIS — I872 Venous insufficiency (chronic) (peripheral): Secondary | ICD-10-CM | POA: Diagnosis not present

## 2023-05-27 DIAGNOSIS — R600 Localized edema: Secondary | ICD-10-CM | POA: Diagnosis present

## 2023-05-27 DIAGNOSIS — E119 Type 2 diabetes mellitus without complications: Secondary | ICD-10-CM | POA: Diagnosis not present

## 2023-05-27 DIAGNOSIS — I10 Essential (primary) hypertension: Secondary | ICD-10-CM | POA: Diagnosis not present

## 2023-05-27 LAB — CBC WITH DIFFERENTIAL/PLATELET
Abs Immature Granulocytes: 0.01 10*3/uL (ref 0.00–0.07)
Basophils Absolute: 0.1 10*3/uL (ref 0.0–0.1)
Basophils Relative: 1 %
Eosinophils Absolute: 0.2 10*3/uL (ref 0.0–0.5)
Eosinophils Relative: 3 %
HCT: 40.1 % (ref 36.0–46.0)
Hemoglobin: 12.8 g/dL (ref 12.0–15.0)
Immature Granulocytes: 0 %
Lymphocytes Relative: 60 %
Lymphs Abs: 4.2 10*3/uL — ABNORMAL HIGH (ref 0.7–4.0)
MCH: 23 pg — ABNORMAL LOW (ref 26.0–34.0)
MCHC: 31.9 g/dL (ref 30.0–36.0)
MCV: 72 fL — ABNORMAL LOW (ref 80.0–100.0)
Monocytes Absolute: 0.4 10*3/uL (ref 0.1–1.0)
Monocytes Relative: 6 %
Neutro Abs: 2.1 10*3/uL (ref 1.7–7.7)
Neutrophils Relative %: 30 %
Platelets: 355 10*3/uL (ref 150–400)
RBC: 5.57 MIL/uL — ABNORMAL HIGH (ref 3.87–5.11)
RDW: 17.1 % — ABNORMAL HIGH (ref 11.5–15.5)
WBC: 7.1 10*3/uL (ref 4.0–10.5)
nRBC: 0.7 % — ABNORMAL HIGH (ref 0.0–0.2)

## 2023-05-27 LAB — BASIC METABOLIC PANEL WITH GFR
Anion gap: 9 (ref 5–15)
Anion gap: 9 (ref 5–15)
BUN: 12 mg/dL (ref 6–20)
BUN: 13 mg/dL (ref 6–20)
CO2: 21 mmol/L — ABNORMAL LOW (ref 22–32)
CO2: 22 mmol/L (ref 22–32)
Calcium: 8.5 mg/dL — ABNORMAL LOW (ref 8.9–10.3)
Calcium: 8.6 mg/dL — ABNORMAL LOW (ref 8.9–10.3)
Chloride: 106 mmol/L (ref 98–111)
Chloride: 107 mmol/L (ref 98–111)
Creatinine, Ser: 0.81 mg/dL (ref 0.44–1.00)
Creatinine, Ser: 0.84 mg/dL (ref 0.44–1.00)
GFR, Estimated: >60 mL/min (ref >60)
GFR, Estimated: >60 mL/min (ref >60)
Glucose, Bld: 127 mg/dL — ABNORMAL HIGH (ref 70–99)
Glucose, Bld: 163 mg/dL — ABNORMAL HIGH (ref 70–99)
Potassium: 3.6 mmol/L (ref 3.5–5.1)
Potassium: 4 mmol/L (ref 3.5–5.1)
Sodium: 137 mmol/L (ref 135–145)
Sodium: 137 mmol/L (ref 135–145)

## 2023-05-27 LAB — CBC
HCT: 38.7 % (ref 36.0–46.0)
Hemoglobin: 12.4 g/dL (ref 12.0–15.0)
MCH: 23 pg — ABNORMAL LOW (ref 26.0–34.0)
MCHC: 32 g/dL (ref 30.0–36.0)
MCV: 71.9 fL — ABNORMAL LOW (ref 80.0–100.0)
Platelets: 387 10*3/uL (ref 150–400)
RBC: 5.38 MIL/uL — ABNORMAL HIGH (ref 3.87–5.11)
RDW: 16.5 % — ABNORMAL HIGH (ref 11.5–15.5)
WBC: 9.3 10*3/uL (ref 4.0–10.5)
nRBC: 0 % (ref 0.0–0.2)

## 2023-05-27 LAB — TROPONIN I (HIGH SENSITIVITY): Troponin I (High Sensitivity): 18 ng/L — ABNORMAL HIGH (ref <18)

## 2023-05-27 MED ORDER — LIDOCAINE 5 % EX PTCH
1.0000 | MEDICATED_PATCH | CUTANEOUS | Status: DC
  Administered 2023-05-27: 1 via TRANSDERMAL
  Filled 2023-05-27: qty 1

## 2023-05-27 MED ORDER — DEXAMETHASONE SODIUM PHOSPHATE 4 MG/ML IJ SOLN
4.0000 mg | Freq: Once | INTRAMUSCULAR | Status: AC
Start: 2023-05-27 — End: 2023-05-27
  Administered 2023-05-27: 4 mg via INTRAVENOUS
  Filled 2023-05-27: qty 1

## 2023-05-27 MED ORDER — LIDOCAINE 5 % EX PTCH: 1.0000 | MEDICATED_PATCH | CUTANEOUS | 0 refills | Status: AC

## 2023-05-27 MED ORDER — IOHEXOL 350 MG/ML SOLN
75.0000 mL | Freq: Once | INTRAVENOUS | Status: AC | PRN
  Administered 2023-05-27: 75 mL via INTRAVENOUS

## 2023-05-27 NOTE — ED Provider Notes (Signed)
 [place] EMERGENCY DEPARTMENT AT [facility] Provider Note   CSN: [id number] Arrival date & time: 05/26/23  2249     History  Chief Complaint  Patient presents with   Neck Pain    Ashley [name] is a 58 y.o. female.  The history is provided by the patient.  Neck Pain Pain location:  Generalized neck Quality:  Aching Pain radiates to:  Does not radiate Pain severity:  Moderate Pain is:  Same all the time Onset quality:  Gradual Duration:  1 day Timing:  Constant Progression:  Unchanged Chronicity:  New Context: not jumping from heights and not lifting a heavy object   Relieved by:  Nothing Worsened by:  Nothing Ineffective treatments: neurontin started for this. Associated symptoms: no bladder incontinence, no leg pain and no tingling   Risk factors: no hx of head and neck radiation        Home Medications Prior to Admission medications   Medication Sig Start Date End Date Taking? Authorizing Provider  lidocaine  (LIDODERM ) 5 % Place 1 patch onto the skin daily. Remove & Discard patch within 12 hours or as directed by MD 05/27/23  Yes Carriann Hesse, MD  albuterol  (VENTOLIN  HFA) 108 (90 Base) MCG/ACT inhaler Inhale 2 puffs into the lungs every 6 (six) hours as needed for up to 10 days for wheezing or shortness of breath. 02/23/23 03/05/23  Acevedo, Gerrie, PA  Ascorbic Acid (VITAMIN C PO) Take 1 tablet by mouth daily.    [provider]  atenolol (TENORMIN) 25 MG tablet Take 25 mg by mouth at bedtime. 07/05/20   [provider]  atorvastatin  (LIPITOR) 40 MG tablet Take 1 tablet (40 mg total) by mouth daily. 09/14/20   Aura Leeds Latif, DO  cetirizine  (ZYRTEC ) 10 MG chewable tablet Chew 1 tablet (10 mg total) by mouth daily. 11/26/22   Mayer, Jodi R, NP  Cyanocobalamin (VITAMIN B-12 PO) Take 1 tablet by mouth daily.    [provider]  diclofenac  Sodium (VOLTAREN ) 1 % GEL Apply 4 g topically 4 (four) times daily. Apply to affected areas  4 times daily as needed for pain. 09/19/21   Eloise Hake Scales, PA-C  doxycycline  (VIBRAMYCIN ) 100 MG capsule Take 1 capsule (100 mg total) by mouth 2 (two) times daily. 05/16/23   Kelsey Patricia, MD  DULoxetine (CYMBALTA) 20 MG capsule Take 20 mg by mouth daily.    [provider]  esomeprazole (NEXIUM) 40 MG capsule Take 40 mg by mouth daily before breakfast.    [provider]  fluticasone  (FLONASE ) 50 MCG/ACT nasal spray Place 1 spray into both nostrils daily. 11/26/22   Mayer, Jodi R, NP  gabapentin (NEURONTIN) 300 MG capsule Take 300 mg by mouth 2 (two) times daily.    [provider]  hydrochlorothiazide (HYDRODIURIL) 25 MG tablet Take 25 mg by mouth daily. 09/11/20   [provider]  Levetiracetam 750 MG TB24 Take 750 mg by mouth in the morning and at bedtime. 09/06/20   [provider]  lidocaine  (LIDODERM ) 5 % Place 1 patch onto the skin daily. Remove & Discard patch within 12 hours or as directed by MD 05/20/23   Denese Finn, PA-C  losartan  (COZAAR ) 100 MG tablet Take 1 tablet (100 mg total) by mouth daily. 09/15/20 10/15/20  Aura Leeds Latif, DO  metFORMIN  (GLUCOPHAGE -XR) 500 MG 24 hr tablet Take 1,000 mg by mouth every morning. 06/18/20   [provider]  nitroGLYCERIN  (NITROSTAT ) 0.4 MG SL  tablet Place 1 tablet (0.4 mg total) under the tongue every 5 (five) minutes as needed for chest pain. 09/14/20   Aura Leeds Latif, DO  predniSONE  (DELTASONE ) 10 MG tablet Take 4 tablets (40 mg total) by mouth daily. 05/16/23   Kelsey Patricia, MD  senna-docusate (SENOKOT-S) 8.6-50 MG tablet Take 1 tablet by mouth at bedtime as needed for mild constipation. 09/14/20   Sheikh, Omair Latif, DO  VITAMIN A PO Take 1 tablet by mouth daily.    [provider]  VITAMIN E PO Take 1 tablet by mouth daily.    [provider]  zolmitriptan (ZOMIG) 5 MG tablet Take 5 mg by mouth daily as needed for migraine.    [provider]       Allergies    Penicillins and Metformin  and related    Review of Systems   Review of Systems  Genitourinary:  Negative for bladder incontinence.  Musculoskeletal:  Positive for neck pain. Negative for neck stiffness.  Neurological:  Negative for tingling.  All other systems reviewed and are negative.   Physical Exam Updated Vital Signs BP 120/72 (BP Location: Left Arm)   Pulse (!) 57   Temp (!) 97.5 F (36.4 C)   Resp 18   LMP 07/13/2011   SpO2 100%  Physical Exam Vitals and nursing note reviewed.  Constitutional:      General: She is not in acute distress.    Appearance: Normal appearance. She is well-developed.  HENT:     Head: Normocephalic and atraumatic.     Nose: Nose normal.  Eyes:     Pupils: Pupils are equal, round, and reactive to light.  Neck:     Vascular: No carotid bruit.  Cardiovascular:     Rate and Rhythm: Normal rate and regular rhythm.     Pulses: Normal pulses.     Heart sounds: Normal heart sounds.  Pulmonary:     Effort: Pulmonary effort is normal. No respiratory distress.     Breath sounds: Normal breath sounds.  Abdominal:     General: Bowel sounds are normal. There is no distension.     Palpations: Abdomen is soft.     Tenderness: There is no abdominal tenderness. There is no guarding or rebound.  Musculoskeletal:        General: Normal range of motion.     Cervical back: Normal range of motion and neck supple. No rigidity or tenderness.  Lymphadenopathy:     Cervical: No cervical adenopathy.  Skin:    General: Skin is warm and dry.     Capillary Refill: Capillary refill takes less than 2 seconds.     Findings: No erythema or rash.  Neurological:     General: No focal deficit present.     Mental Status: She is alert and oriented to person, place, and time.     Deep Tendon Reflexes: Reflexes normal.  Psychiatric:        Mood and Affect: Mood normal.     ED Results / Procedures / Treatments   Labs (all labs ordered are  listed, but only abnormal results are displayed) Results for orders placed or performed during the hospital encounter of 05/26/23  CBC with Differential/Platelet   Collection Time: 05/27/23 12:09 AM  Result Value Ref Range   WBC 7.1 4.0 - 10.5 K/uL   RBC 5.57 (H) 3.87 - 5.11 MIL/uL   Hemoglobin 12.8 12.0 - 15.0 g/dL   HCT 16.1 09.6 - 04.5 %   MCV  72.0 (L) 80.0 - 100.0 fL   MCH 23.0 (L) 26.0 - 34.0 pg   MCHC 31.9 30.0 - 36.0 g/dL   RDW 54.0 (H) 98.1 - 19.1 %   Platelets 355 150 - 400 K/uL   nRBC 0.7 (H) 0.0 - 0.2 %   Neutrophils Relative % 30 %   Neutro Abs 2.1 1.7 - 7.7 K/uL   Lymphocytes Relative 60 %   Lymphs Abs 4.2 (H) 0.7 - 4.0 K/uL   Monocytes Relative 6 %   Monocytes Absolute 0.4 0.1 - 1.0 K/uL   Eosinophils Relative 3 %   Eosinophils Absolute 0.2 0.0 - 0.5 K/uL   Basophils Relative 1 %   Basophils Absolute 0.1 0.0 - 0.1 K/uL   Immature Granulocytes 0 %   Abs Immature Granulocytes 0.01 0.00 - 0.07 K/uL  Basic metabolic panel with GFR   Collection Time: 05/27/23 12:09 AM  Result Value Ref Range   Sodium 137 135 - 145 mmol/L   Potassium 4.0 3.5 - 5.1 mmol/L   Chloride 106 98 - 111 mmol/L   CO2 22 22 - 32 mmol/L   Glucose, Bld 127 (H) 70 - 99 mg/dL   BUN 12 6 - 20 mg/dL   Creatinine, Ser 4.78 0.44 - 1.00 mg/dL   Calcium  8.5 (L) 8.9 - 10.3 mg/dL   GFR, Estimated >29 >56 mL/min   Anion gap 9 5 - 15   CT Angio Head Neck W WO CM Result Date: 05/27/2023 CLINICAL DATA:  Neck trauma, arterial injury suspected EXAM: CT ANGIOGRAPHY HEAD AND NECK WITH AND WITHOUT CONTRAST TECHNIQUE: Multidetector CT imaging of the head and neck was performed using the standard protocol during bolus administration of intravenous contrast. Multiplanar CT image reconstructions and MIPs were obtained to evaluate the vascular anatomy. Carotid stenosis measurements (when applicable) are obtained utilizing NASCET criteria, using the distal internal carotid diameter as the denominator. RADIATION DOSE  REDUCTION: This exam was performed according to the departmental dose-optimization program which includes automated exposure control, adjustment of the mA and/or kV according to patient size and/or use of iterative reconstruction technique. CONTRAST:  75mL OMNIPAQUE IOHEXOL 350 MG/ML SOLN COMPARISON:  MRI head December 09, 2010. FINDINGS: CT HEAD FINDINGS Brain: No evidence of acute infarction, hemorrhage, hydrocephalus, extra-axial collection or mass lesion/mass effect. Prominent pituitary with similar suprasellar extension, better characterized on 2012 pituitary protocol MRI. Vascular: See below. Skull: No acute fracture. Sinuses/Orbits: Clear sinuses.  No acute orbital findings. Other: No mastoid effusions. Review of the MIP images confirms the above findings CTA NECK FINDINGS Aortic arch: Great vessel origins are patent without significant stenosis. Right carotid system: No evidence of dissection, stenosis (50% or greater), or occlusion. Very mild/subtle regularity of the distal ICA. Left carotid system: No evidence of dissection, stenosis (50% or greater), or occlusion. Mild irregularity of the mid ICA. Vertebral arteries: Right dominant. No evidence of dissection, stenosis (50% or greater), or occlusion. Skeleton: No evidence of acute abnormality on limited assessment. Multilevel degenerative change. Other neck: No evidence of acute abnormality on limited assessment. Upper chest: Visualized lung apices are clear. Review of the MIP images confirms the above findings CTA HEAD FINDINGS Anterior circulation: Bilateral or intracranial ICAs, MCAs, and ACAs are patent without proximal hemodynamically significant stenosis. No aneurysm identified. Posterior circulation: Bilateral intradural vertebral arteries, basilar artery and bilateral posterior cerebral arteries are patent without proximal hemodynamically significant stenosis. No aneurysm identified. Venous sinuses: As permitted by contrast timing, patent. Review  of the MIP images confirms the  above findings IMPRESSION: CTA: 1. No evidence of acute arterial injury, large vessel occlusion, or significant stenosis. 2. Mild bilateral ICA irregularity in the neck, possibly secondary to fibromuscular dysplasia. CT head: 1. No evidence of acute intracranial abnormality. 2. Prominent pituitary with similar suprasellar extension, better characterized on 2012 pituitary protocol MRI. Electronically Signed   By: Stevenson Elbe M.D.   On: 05/27/2023 01:53   DG Sacrum/Coccyx Result Date: 05/20/2023 CLINICAL DATA:  Fall.  Low back and sacral pain. EXAM: SACRUM AND COCCYX - 2+ VIEW COMPARISON:  None FINDINGS: There is no evidence of fracture or other focal bone lesions. IMPRESSION: Negative. Electronically Signed   By: Donnal Fusi M.D.   On: 05/20/2023 07:03   DG Lumbar Spine 2-3 Views Result Date: 05/20/2023 CLINICAL DATA:  Low back and sacral pain. EXAM: LUMBAR SPINE - 2-3 VIEW COMPARISON:  09/19/2021 FINDINGS: No evidence for an acute fracture. Loss of disc height again noted at L4-5 with stable trace anterolisthesis of L4 on 5. Remaining intervertebral disc spaces are largely preserved. SI joints unremarkable. IMPRESSION: Stable exam. Degenerative disc disease at L4-5 with trace anterolisthesis of L4 on 5. Electronically Signed   By: Donnal Fusi M.D.   On: 05/20/2023 07:02   DG Chest 2 View Result Date: 05/13/2023 CLINICAL DATA:  Chest pain EXAM: CHEST - 2 VIEW COMPARISON:  04/11/2023 FINDINGS: The heart size and mediastinal contours are within normal limits. Both lungs are clear. The visualized skeletal structures are unremarkable. IMPRESSION: No active cardiopulmonary disease. Electronically Signed   By: Esmeralda Hedge M.D.   On: 05/13/2023 00:55   DG Foot Complete Right Result Date: 04/29/2023 CLINICAL DATA:  Right foot pain and swelling EXAM: RIGHT FOOT COMPLETE - 3+ VIEW COMPARISON:  None Available. FINDINGS: Normal alignment. No acute fracture or dislocation.  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 Remaining joint spaces are preserved. Soft tissues are unremarkable. IMPRESSION: 1. 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  She Electronically Signed   By: Worthy Heads M.D.   On: 04/29/2023 03:30    Radiology CT Angio Head Neck W WO CM Result Date: 05/27/2023 CLINICAL DATA:  Neck trauma, arterial injury suspected EXAM: CT ANGIOGRAPHY HEAD AND NECK WITH AND WITHOUT CONTRAST TECHNIQUE: Multidetector CT imaging of the head and neck was performed using the standard protocol during bolus administration of intravenous contrast. Multiplanar CT image reconstructions and MIPs were obtained to evaluate the vascular anatomy. Carotid stenosis measurements (when applicable) are obtained utilizing NASCET criteria, using the distal internal carotid diameter as the denominator. RADIATION DOSE REDUCTION: This exam was performed according to the departmental dose-optimization program which includes automated exposure control, adjustment of the mA and/or kV according to patient size and/or use of iterative reconstruction technique. CONTRAST:  75mL OMNIPAQUE IOHEXOL 350 MG/ML SOLN COMPARISON:  MRI head December 09, 2010. FINDINGS: CT HEAD FINDINGS Brain: No evidence of acute infarction, hemorrhage, hydrocephalus, extra-axial collection or mass lesion/mass effect. Prominent pituitary with similar suprasellar extension, better characterized on 2012 pituitary protocol MRI. Vascular: See below. Skull: No acute fracture. Sinuses/Orbits: Clear sinuses.  No acute orbital findings. Other: No mastoid effusions. Review of the MIP images confirms the above findings CTA NECK FINDINGS Aortic arch: Great vessel origins are patent without significant stenosis. Right carotid system: No evidence of dissection, stenosis (50% or greater), or occlusion. Very mild/subtle regularity of the distal ICA. Left carotid system: No evidence of dissection, stenosis (50% or greater), or  occlusion. Mild irregularity of the mid ICA. Vertebral  arteries: Right dominant. No evidence of dissection, stenosis (50% or greater), or occlusion. Skeleton: No evidence of acute abnormality on limited assessment. Multilevel degenerative change. Other neck: No evidence of acute abnormality on limited assessment. Upper chest: Visualized lung apices are clear. Review of the MIP images confirms the above findings CTA HEAD FINDINGS Anterior circulation: Bilateral or intracranial ICAs, MCAs, and ACAs are patent without proximal hemodynamically significant stenosis. No aneurysm identified. Posterior circulation: Bilateral intradural vertebral arteries, basilar artery and bilateral posterior cerebral arteries are patent without proximal hemodynamically significant stenosis. No aneurysm identified. Venous sinuses: As permitted by contrast timing, patent. Review of the MIP images confirms the above findings IMPRESSION: CTA: 1. No evidence of acute arterial injury, large vessel occlusion, or significant stenosis. 2. Mild bilateral ICA irregularity in the neck, possibly secondary to fibromuscular dysplasia. CT head: 1. No evidence of acute intracranial abnormality. 2. Prominent pituitary with similar suprasellar extension, better characterized on 2012 pituitary protocol MRI. Electronically Signed   By: Stevenson Elbe M.D.   On: 05/27/2023 01:53    Procedures Procedures    Medications Ordered in ED Medications  lidocaine  (LIDODERM ) 5 % 1 patch (1 patch Transdermal Patch Applied 05/27/23 0550)  iohexol (OMNIPAQUE) 350 MG/ML injection 75 mL (75 mLs Intravenous Contrast Given 05/27/23 0132)  dexamethasone  (DECADRON ) injection 4 mg (4 mg Intravenous Given 05/27/23 0549)    ED Course/ Medical Decision Making/ A&P                                 Medical Decision Making Neck pain without trauma.    Amount and/or Complexity of Data Reviewed External Data Reviewed: notes.    Details: Previous notes from other  hospitals reviewed  Labs: ordered.    Details: Normal white count 7.1, normal hemoglobin 12.8, normal platelets. Normal sodium 137, normal potassium 4, normal creatinine  Radiology: ordered and independent interpretation performed.    Details: CTA brain without occlusion   Risk Prescription drug management. Risk Details: Well appearing.  No trauma. Stable for discharge will need to follow up with vascular for ongoing care. Stable for discharge.  Strict returns,      Final Clinical Impression(s) / ED Diagnoses Final diagnoses:  Neck pain   No signs of systemic illness or infection. The patient is nontoxic-appearing on exam and vital signs are within normal limits.  I have reviewed the triage vital signs and the nursing notes. Pertinent labs & imaging results that were available during my care of the patient were reviewed by me and considered in my medical decision making (see chart for details). After history, exam, and medical workup I feel the patient has been appropriately medically screened and is safe for discharge home. Pertinent diagnoses were discussed with the patient. Patient was given return precautions. Rx / DC Orders ED Discharge Orders          Ordered    lidocaine  (LIDODERM ) 5 %  Every 24 hours        05/27/23 0625              Luken Shadowens, MD 05/27/23 6045

## 2023-05-27 NOTE — ED Triage Notes (Signed)
 Patient ambulatory to triage with steady gait, without difficulty or distress noted; pt reports for last wk having swelling to feet, no pain and no swelling at present; also reports having some mid CP, nonradiating; denies hx of same

## 2023-05-28 ENCOUNTER — Emergency Department
Admission: EM | Admit: 2023-05-28 | Discharge: 2023-05-28 | Disposition: A | Discharge status: Home / Self Care | Attending: Emergency Medicine | Admitting: Emergency Medicine

## 2023-05-28 DIAGNOSIS — I872 Venous insufficiency (chronic) (peripheral): Secondary | ICD-10-CM

## 2023-05-28 DIAGNOSIS — R600 Localized edema: Secondary | ICD-10-CM

## 2023-05-28 LAB — MAGNESIUM: Magnesium: 2.1 mg/dL (ref 1.7–2.4)

## 2023-05-28 LAB — TROPONIN I (HIGH SENSITIVITY): Troponin I (High Sensitivity): 14 ng/L (ref <18)

## 2023-05-28 NOTE — ED Notes (Signed)
 Pt found sleeping in lobby, st did not hear her name called earlier; pt placed back on WR board

## 2023-05-28 NOTE — ED Provider Notes (Signed)
 [facility] Provider Note    Event Date/Time   First MD Initiated Contact with Patient 05/28/23 [phone]     (approximate)   History   Chief Complaint Leg Swelling   HPI  Ashley [name] is a 58 y.o. female with past medical history of hypertension, diabetes, and migraines who presents to the ED complaining of leg swelling.  Patient reports that she was exercising on a treadmill yesterday afternoon when she began to notice significant swelling in both of her legs.  She reports discomfort associated with this, denies any chest pain or shortness of breath.  She came to the ED to be evaluated, initially checked in but then stated she felt better sitting outside and waiting.  She was called to be seen but could not be found, subsequently located a couple of hours later.  Since then, she states that the swelling in her legs has improved with no ongoing pain.     Physical Exam   Triage Vital Signs: ED Triage Vitals  Encounter Vitals Group     BP 05/27/23 2230 (!) 143/89     Systolic BP Percentile --      Diastolic BP Percentile --      Pulse Rate 05/27/23 2230 88     Resp 05/27/23 2230 18     Temp 05/27/23 2230 97.8 F (36.6 C)     Temp Source 05/27/23 2230 Oral     SpO2 05/27/23 2230 98 %     Weight 05/27/23 2230 210 lb (95.3 kg)     Height 05/27/23 2230 5\' 7"  (1.702 m)     Head Circumference --      Peak Flow --      Pain Score 05/27/23 2215 0     Pain Loc --      Pain Education --      Exclude from Growth Chart --     Most recent vital signs: Vitals:   05/27/23 2230 05/28/23 0107  BP: (!) 143/89 (!) 150/82  Pulse: 88 80  Resp: 18 18  Temp: 97.8 F (36.6 C) 97.7 F (36.5 C)  SpO2: 98% 98%    Constitutional: Alert and oriented. Eyes: Conjunctivae are normal. Head: Atraumatic. Nose: No congestion/rhinnorhea. Mouth/Throat: Mucous membranes are moist.  Cardiovascular: Normal rate, regular rhythm. Grossly normal heart sounds.  2+ radial and  DP pulses bilaterally. Respiratory: Normal respiratory effort.  No retractions. Lungs CTAB. Gastrointestinal: Soft and nontender. No distention. Musculoskeletal: No lower extremity tenderness nor edema.  Neurologic:  Normal speech and language. No gross focal neurologic deficits are appreciated.    ED Results / Procedures / Treatments   Labs (all labs ordered are listed, but only abnormal results are displayed) Labs Reviewed  CBC - Abnormal; Notable for the following components:      Result Value   RBC 5.38 (*)    MCV 71.9 (*)    MCH 23.0 (*)    RDW 16.5 (*)    All other components within normal limits  BASIC METABOLIC PANEL WITH GFR - Abnormal; Notable for the following components:   CO2 21 (*)    Glucose, Bld 163 (*)    Calcium  8.6 (*)    All other components within normal limits  TROPONIN I (HIGH SENSITIVITY) - Abnormal; Notable for the following components:   Troponin I (High Sensitivity) 18 (*)    All other components within normal limits  MAGNESIUM  TROPONIN I (HIGH SENSITIVITY)     EKG  ED  ECG REPORT I, Twilla Galea, the attending physician, personally viewed and interpreted this ECG.   Date: 05/28/2023  EKG Time: 22:28  Rate: 81  Rhythm: normal sinus rhythm  Axis: Normal  Intervals:Prolonged QT  ST&T Change: None  RADIOLOGY Chest x-ray reviewed and interpreted by me with no infiltrate, edema, or effusion.  PROCEDURES:  Critical Care performed: No  Procedures   MEDICATIONS ORDERED IN ED: Medications - No data to display   IMPRESSION / MDM / ASSESSMENT AND PLAN / ED COURSE  I reviewed the triage vital signs and the nursing notes.                              58 y.o. female with past medical history of hypertension, diabetes, and migraines who presents to the ED complaining of leg swelling and discomfort while exercising earlier today.  Patient's presentation is most consistent with acute presentation with potential threat to life or bodily  function.  Differential diagnosis includes, but is not limited to, arterial insufficiency, venous insufficiency, DVT, cellulitis, CHF.  Patient nontoxic-appearing and in no acute distress, vital signs are unremarkable.  EKG shows no evidence arrhythmia or ischemia and 2 sets of troponin are stable, doubt ACS.  Chest x-ray without evidence of CHF or other acute finding, remainder of labs are reassuring with no significant anemia, leukocytosis, electrolyte abnormality, or AKI.  She is neurovascular intact to her bilateral lower extremities with no findings concerning for infection and I doubt DVT given resolution of symptoms.  Suspect venous insufficiency and patient counseled on compression stockings, counseled to follow-up with her PCP and otherwise return to the ED for new or worsening symptoms.  Patient agrees with plan.      FINAL CLINICAL IMPRESSION(S) / ED DIAGNOSES   Final diagnoses:  Peripheral edema  Venous insufficiency     Rx / DC Orders   ED Discharge Orders     None        Note:  This document was prepared using Dragon voice recognition software and may include unintentional dictation errors.   Twilla Galea, MD 05/28/23 [phone]

## 2023-05-28 NOTE — ED Notes (Signed)
Pt called 3 times for room, no answer

## 2023-06-02 DIAGNOSIS — I872 Venous insufficiency (chronic) (peripheral): Secondary | ICD-10-CM | POA: Insufficient documentation

## 2023-06-02 DIAGNOSIS — K5900 Constipation, unspecified: Secondary | ICD-10-CM | POA: Insufficient documentation

## 2023-06-11 ENCOUNTER — Other Ambulatory Visit: Payer: Self-pay

## 2023-06-11 ENCOUNTER — Emergency Department (HOSPITAL_COMMUNITY)
Admission: EM | Admit: 2023-06-11 | Discharge: 2023-06-12 | Disposition: A | Discharge status: Home / Self Care | Attending: Emergency Medicine | Admitting: Emergency Medicine

## 2023-06-11 ENCOUNTER — Emergency Department (HOSPITAL_COMMUNITY)

## 2023-06-11 DIAGNOSIS — S8992XA Unspecified injury of left lower leg, initial encounter: Secondary | ICD-10-CM | POA: Diagnosis not present

## 2023-06-11 DIAGNOSIS — S8991XA Unspecified injury of right lower leg, initial encounter: Secondary | ICD-10-CM | POA: Diagnosis present

## 2023-06-11 DIAGNOSIS — W1782XA Fall from (out of) grocery cart, initial encounter: Secondary | ICD-10-CM | POA: Insufficient documentation

## 2023-06-11 DIAGNOSIS — W19XXXA Unspecified fall, initial encounter: Secondary | ICD-10-CM

## 2023-06-11 NOTE — ED Triage Notes (Signed)
 Patient reports low back and bilateral knee pain injured yesterday when she lost her balance and fell while pushing a grocery cart . No LOC/ambulatory .

## 2023-06-12 MED ORDER — LIDOCAINE 5 % EX PTCH
1.0000 | MEDICATED_PATCH | CUTANEOUS | 0 refills | Status: DC
  Filled 2023-09-10: qty 30, 30d supply, fill #0

## 2023-06-12 MED ORDER — KETOROLAC TROMETHAMINE 60 MG/2ML IM SOLN
30.0000 mg | Freq: Once | INTRAMUSCULAR | Status: AC
  Administered 2023-06-12: 30 mg via INTRAMUSCULAR
  Filled 2023-06-12: qty 2

## 2023-06-12 NOTE — ED Provider Notes (Signed)
 [place] EMERGENCY DEPARTMENT AT [facility] Provider Note   CSN: [id number] Arrival date & time: 06/11/23  2148     History  No chief complaint on file.   Ashley [name] is a 58 y.o. female.  The history is provided by the patient.  Fall This is a new problem. The current episode started yesterday. The problem occurs rarely. The problem has been resolved. Pertinent negatives include no chest pain, no abdominal pain, no headaches and no shortness of breath. Associated symptoms comments: Hit knees on ground pushing a cart . Nothing aggravates the symptoms. Nothing relieves the symptoms. She has tried nothing for the symptoms. The treatment provided no relief.       Home Medications Prior to Admission medications   Medication Sig Start Date End Date Taking? Authorizing Provider  albuterol  (VENTOLIN  HFA) 108 (90 Base) MCG/ACT inhaler Inhale 2 puffs into the lungs every 6 (six) hours as needed for up to 10 days for wheezing or shortness of breath. 02/23/23 03/05/23  Acevedo, Vadie, PA  Ascorbic Acid (VITAMIN C PO) Take 1 tablet by mouth daily.    [provider]  atenolol (TENORMIN) 25 MG tablet Take 25 mg by mouth at bedtime. 07/05/20   [provider]  atorvastatin  (LIPITOR) 40 MG tablet Take 1 tablet (40 mg total) by mouth daily. 09/14/20   Aura Leeds Latif, DO  cetirizine  (ZYRTEC ) 10 MG chewable tablet Chew 1 tablet (10 mg total) by mouth daily. 11/26/22   Mayer, Jodi R, NP  Cyanocobalamin (VITAMIN B-12 PO) Take 1 tablet by mouth daily.    [provider]  diclofenac  Sodium (VOLTAREN ) 1 % GEL Apply 4 g topically 4 (four) times daily. Apply to affected areas 4 times daily as needed for pain. 09/19/21   Eloise Hake Scales, PA-C  doxycycline  (VIBRAMYCIN ) 100 MG capsule Take 1 capsule (100 mg total) by mouth 2 (two) times daily. 05/16/23   Kelsey Patricia, MD  DULoxetine (CYMBALTA) 20 MG capsule Take 20 mg by mouth daily.    [provider]  esomeprazole (NEXIUM) 40 MG capsule Take 40 mg by mouth daily before breakfast.    [provider]  fluticasone  (FLONASE ) 50 MCG/ACT nasal spray Place 1 spray into both nostrils daily. 11/26/22   Mayer, Jodi R, NP  gabapentin (NEURONTIN) 300 MG capsule Take 300 mg by mouth 2 (two) times daily.    [provider]  hydrochlorothiazide (HYDRODIURIL) 25 MG tablet Take 25 mg by mouth daily. 09/11/20   [provider]  Levetiracetam 750 MG TB24 Take 750 mg by mouth in the morning and at bedtime. 09/06/20   [provider]  lidocaine  (LIDODERM ) 5 % Place 1 patch onto the skin daily. Remove & Discard patch within 12 hours or as directed by MD 05/20/23   Aleatha Hunting T, PA-C  lidocaine  (LIDODERM ) 5 % Place 1 patch onto the skin daily. Remove & Discard patch within 12 hours or as directed by MD 05/27/23   Maralee Senate, Strider Vallance, MD  losartan  (COZAAR ) 100 MG tablet Take 1 tablet (100 mg total) by mouth daily. 09/15/20 10/15/20  Aura Leeds Latif, DO  metFORMIN  (GLUCOPHAGE -XR) 500 MG 24 hr tablet Take 1,000 mg by mouth every morning. 06/18/20   [provider]  nitroGLYCERIN  (NITROSTAT ) 0.4 MG SL tablet Place 1 tablet (0.4 mg total) under the tongue every 5 (five) minutes as needed for chest pain. 09/14/20   Aura Leeds Latif, DO  predniSONE  (DELTASONE ) 10 MG tablet Take 4  tablets (40 mg total) by mouth daily. 05/16/23   Kelsey Patricia, MD  senna-docusate (SENOKOT-S) 8.6-50 MG tablet Take 1 tablet by mouth at bedtime as needed for mild constipation. 09/14/20   Sheikh, Omair Latif, DO  VITAMIN A PO Take 1 tablet by mouth daily.    [provider]  VITAMIN E PO Take 1 tablet by mouth daily.    [provider]  zolmitriptan (ZOMIG) 5 MG tablet Take 5 mg by mouth daily as needed for migraine.    [provider]      Allergies    Penicillins and Metformin  and related    Review of Systems   Review of Systems  Respiratory:  Negative for shortness of  breath.   Cardiovascular:  Negative for chest pain.  Gastrointestinal:  Negative for abdominal pain.  Neurological:  Negative for headaches.    Physical Exam Updated Vital Signs BP 116/77   Pulse 61   Temp 97.6 F (36.4 C) (Oral)   Resp 17   LMP 07/13/2011   SpO2 100%  Physical Exam Vitals and nursing note reviewed.  Constitutional:      General: She is not in acute distress.    Appearance: Normal appearance. She is well-developed.  HENT:     Head: Normocephalic and atraumatic.     Nose: Nose normal.  Eyes:     Pupils: Pupils are equal, round, and reactive to light.  Cardiovascular:     Rate and Rhythm: Normal rate and regular rhythm.     Pulses: Normal pulses.     Heart sounds: Normal heart sounds.  Pulmonary:     Effort: Pulmonary effort is normal. No respiratory distress.     Breath sounds: Normal breath sounds.  Abdominal:     General: Bowel sounds are normal. There is no distension.     Palpations: Abdomen is soft.     Tenderness: There is no abdominal tenderness. There is no guarding or rebound.  Musculoskeletal:        General: Normal range of motion.     Cervical back: Normal range of motion and neck supple.     Right knee: No swelling, deformity, effusion or ecchymosis. No LCL laxity, MCL laxity, ACL laxity or PCL laxity. Normal meniscus and normal patellar mobility.     Left knee: No swelling, deformity, effusion or ecchymosis. No LCL laxity, MCL laxity, ACL laxity or PCL laxity.Normal meniscus and normal patellar mobility.  Skin:    General: Skin is warm and dry.     Capillary Refill: Capillary refill takes less than 2 seconds.     Findings: No erythema or rash.  Neurological:     General: No focal deficit present.     Mental Status: She is alert and oriented to person, place, and time.     Deep Tendon Reflexes: Reflexes normal.  Psychiatric:        Mood and Affect: Mood normal.     ED Results / Procedures / Treatments   Labs (all labs ordered are  listed, but only abnormal results are displayed) Labs Reviewed - No data to display  EKG None  Radiology DG Knee Complete 4 Views Left Result Date: 06/11/2023 CLINICAL DATA:  Injury and pain EXAM: LEFT KNEE - COMPLETE 4+ VIEW COMPARISON:  Left knee x-ray 05/14/2023 FINDINGS: No evidence of fracture, dislocation, or joint effusion. No evidence of arthropathy or other focal bone abnormality. Soft tissues are unremarkable. IMPRESSION: Negative. Electronically Signed   By: Rollen Clines.D.  On: 06/11/2023 22:56   DG Knee Complete 4 Views Right Result Date: 06/11/2023 CLINICAL DATA:  Injury and pain EXAM: RIGHT KNEE - COMPLETE 4+ VIEW COMPARISON:  None Available. FINDINGS: No evidence of fracture, dislocation, or joint effusion. No evidence of arthropathy or other focal bone abnormality. Soft tissues are unremarkable. IMPRESSION: Negative. Electronically Signed   By: Tyron Gallon M.D.   On: 06/11/2023 22:56   DG Lumbar Spine Complete Result Date: 06/11/2023 CLINICAL DATA:  Injury, pain. EXAM: LUMBAR SPINE - COMPLETE 4+ VIEW COMPARISON:  Lumbar spine x-ray 05/20/2023. FINDINGS: Spinal alignment is normal. There is no evidence fracture. There is severe degenerative disc changes at L4-L5 with sclerosis and endplate osteophyte formation similar to the prior study. Disc spaces are otherwise well maintained. Soft tissues are within normal limits. IMPRESSION: 1. No acute fracture or subluxation. 2. Stable severe degenerative disc changes at L4-L5. Electronically Signed   By: Tyron Gallon M.D.   On: 06/11/2023 22:55    Procedures Procedures    Medications Ordered in ED Medications  ketorolac  (TORADOL ) injection 30 mg (has no administration in time range)    ED Course/ Medical Decision Making/ A&P                                 Medical Decision Making Well appearing FROM fell one day ago with pain   Amount and/or Complexity of Data Reviewed External Data Reviewed: notes.    Details:  Previous notes reviewed  Radiology: ordered and independent interpretation performed.    Details: No trauma to knees   Risk Prescription drug management. Risk Details: Well appearing with normal exam.  Ice elevation, lidoderm  and NSAIDs    Final Clinical Impression(s) / ED Diagnoses Final diagnoses:  None   No signs of systemic illness or infection. The patient is nontoxic-appearing on exam and vital signs are within normal limits.  I have reviewed the triage vital signs and the nursing notes. Pertinent labs & imaging results that were available during my care of the patient were reviewed by me and considered in my medical decision making (see chart for details). After history, exam, and medical workup I feel the patient has been appropriately medically screened and is safe for discharge home. Pertinent diagnoses were discussed with the patient. Patient was given return precautions.    Rx / DC Orders ED Discharge Orders     None         Giuliana Handyside, MD 06/12/23 [phone]

## 2023-06-20 ENCOUNTER — Other Ambulatory Visit: Payer: Self-pay

## 2023-06-20 ENCOUNTER — Encounter (HOSPITAL_BASED_OUTPATIENT_CLINIC_OR_DEPARTMENT_OTHER): Payer: Self-pay | Admitting: Emergency Medicine

## 2023-06-20 DIAGNOSIS — M79644 Pain in right finger(s): Secondary | ICD-10-CM | POA: Insufficient documentation

## 2023-06-20 DIAGNOSIS — Z7984 Long term (current) use of oral hypoglycemic drugs: Secondary | ICD-10-CM | POA: Diagnosis not present

## 2023-06-20 DIAGNOSIS — M545 Low back pain, unspecified: Secondary | ICD-10-CM | POA: Diagnosis present

## 2023-06-20 DIAGNOSIS — W1801XA Striking against sports equipment with subsequent fall, initial encounter: Secondary | ICD-10-CM | POA: Diagnosis not present

## 2023-06-20 DIAGNOSIS — Y9239 Other specified sports and athletic area as the place of occurrence of the external cause: Secondary | ICD-10-CM | POA: Diagnosis not present

## 2023-06-20 DIAGNOSIS — M79672 Pain in left foot: Secondary | ICD-10-CM | POA: Diagnosis not present

## 2023-06-20 DIAGNOSIS — Y9301 Activity, walking, marching and hiking: Secondary | ICD-10-CM | POA: Diagnosis not present

## 2023-06-20 DIAGNOSIS — M79671 Pain in right foot: Secondary | ICD-10-CM | POA: Insufficient documentation

## 2023-06-20 NOTE — ED Triage Notes (Signed)
 Pt arrives POV w/ c/o falling off treadmill. Pt reports she was running fast and fell off. Pt reports she landed on back and twisted her feet. Pt reports bilateral  feet pain, right index finger pain and back pain.

## 2023-06-21 ENCOUNTER — Emergency Department (HOSPITAL_BASED_OUTPATIENT_CLINIC_OR_DEPARTMENT_OTHER)
Admission: EM | Admit: 2023-06-21 | Discharge: 2023-06-21 | Disposition: A | Discharge status: Home / Self Care | Attending: Emergency Medicine | Admitting: Emergency Medicine

## 2023-06-21 ENCOUNTER — Emergency Department (HOSPITAL_BASED_OUTPATIENT_CLINIC_OR_DEPARTMENT_OTHER): Admitting: Radiology

## 2023-06-21 ENCOUNTER — Emergency Department (HOSPITAL_COMMUNITY)
Admission: EM | Admit: 2023-06-21 | Discharge: 2023-06-22 | Disposition: A | Discharge status: Home / Self Care | Source: Home / Self Care | Attending: Emergency Medicine | Admitting: Emergency Medicine

## 2023-06-21 ENCOUNTER — Other Ambulatory Visit: Payer: Self-pay

## 2023-06-21 ENCOUNTER — Encounter (HOSPITAL_COMMUNITY): Payer: Self-pay

## 2023-06-21 DIAGNOSIS — T07XXXA Unspecified multiple injuries, initial encounter: Secondary | ICD-10-CM

## 2023-06-21 DIAGNOSIS — Z7984 Long term (current) use of oral hypoglycemic drugs: Secondary | ICD-10-CM | POA: Insufficient documentation

## 2023-06-21 DIAGNOSIS — M545 Low back pain, unspecified: Secondary | ICD-10-CM | POA: Insufficient documentation

## 2023-06-21 DIAGNOSIS — W19XXXA Unspecified fall, initial encounter: Secondary | ICD-10-CM

## 2023-06-21 MED ORDER — METHOCARBAMOL 500 MG PO TABS
500.0000 mg | ORAL_TABLET | Freq: Once | ORAL | Status: AC
  Administered 2023-06-21: 500 mg via ORAL
  Filled 2023-06-21: qty 1

## 2023-06-21 MED ORDER — LIDOCAINE 5 % EX PTCH
1.0000 | MEDICATED_PATCH | CUTANEOUS | 0 refills | Status: AC
Start: 2023-06-21 — End: ?

## 2023-06-21 MED ORDER — METHOCARBAMOL 500 MG PO TABS: 500.0000 mg | ORAL_TABLET | Freq: Two times a day (BID) | ORAL | 0 refills | Status: DC

## 2023-06-21 MED ORDER — LIDOCAINE 5 % EX PTCH
2.0000 | MEDICATED_PATCH | CUTANEOUS | Status: DC
  Administered 2023-06-21: 2 via TRANSDERMAL
  Filled 2023-06-21: qty 2

## 2023-06-21 NOTE — ED Triage Notes (Signed)
 Pt states she fell off treadmill yesterday. Was seen & discharged home with OTC pain control for lower back pain. States she is leaving for out of town tomorrow & the pain is too much. She is requesting something stronger. Pt independently ambulatory at time of triage.

## 2023-06-21 NOTE — Discharge Instructions (Addendum)
 Today you were seen for low back pain.  Please pick up your medication and take as prescribed.  You may also alternate taking Tylenol /Motrin  as needed for pain.  Please do not drive while taking your muscle relaxers as they may make you drowsy.  Thank you for letting us  treat you today. After reviewing your labs and imaging, I feel you are safe to go home. Please follow up with your PCP in the next several days and provide them with your records from this visit. Return to the Emergency Room if pain becomes severe or symptoms worsen.

## 2023-06-21 NOTE — ED Notes (Signed)
 Pt ambulated to rm 14 with steady gait. Placed on BP cuff and pulse ox. Awaiting provider.

## 2023-06-21 NOTE — ED Provider Notes (Signed)
 [place] EMERGENCY DEPARTMENT AT [facility] Provider Note   CSN: [id number] Arrival date & time: 06/20/23  2131     History  Chief Complaint  Patient presents with   Ashley [name]    Ashley [name] is a 58 y.o. female.  Patient is a 58 year old female presenting with injuries sustained in a fall.  She was walking on a treadmill at a local gym when she fell and injured her low back, both feet, and right index finger.  She denies any head injury or loss of consciousness.  She is able to ambulate, but with some discomfort.       Home Medications Prior to Admission medications   Medication Sig Start Date End Date Taking? Authorizing Provider  albuterol  (VENTOLIN  HFA) 108 (90 Base) MCG/ACT inhaler Inhale 2 puffs into the lungs every 6 (six) hours as needed for up to 10 days for wheezing or shortness of breath. 02/23/23 03/05/23  Acevedo, Selisa, PA  Ascorbic Acid (VITAMIN C PO) Take 1 tablet by mouth daily.    [provider]  atenolol (TENORMIN) 25 MG tablet Take 25 mg by mouth at bedtime. 07/05/20   [provider]  atorvastatin  (LIPITOR) 40 MG tablet Take 1 tablet (40 mg total) by mouth daily. 09/14/20   Aura Leeds Latif, DO  cetirizine  (ZYRTEC ) 10 MG chewable tablet Chew 1 tablet (10 mg total) by mouth daily. 11/26/22   Mayer, Jodi R, NP  Cyanocobalamin (VITAMIN B-12 PO) Take 1 tablet by mouth daily.    [provider]  diclofenac  Sodium (VOLTAREN ) 1 % GEL Apply 4 g topically 4 (four) times daily. Apply to affected areas 4 times daily as needed for pain. 09/19/21   Eloise Hake Scales, PA-C  doxycycline  (VIBRAMYCIN ) 100 MG capsule Take 1 capsule (100 mg total) by mouth 2 (two) times daily. 05/16/23   Kelsey Patricia, MD  DULoxetine (CYMBALTA) 20 MG capsule Take 20 mg by mouth daily.    [provider]  esomeprazole (NEXIUM) 40 MG capsule Take 40 mg by mouth daily before breakfast.    [provider]  fluticasone  (FLONASE ) 50 MCG/ACT  nasal spray Place 1 spray into both nostrils daily. 11/26/22   Mayer, Jodi R, NP  gabapentin (NEURONTIN) 300 MG capsule Take 300 mg by mouth 2 (two) times daily.    [provider]  hydrochlorothiazide (HYDRODIURIL) 25 MG tablet Take 25 mg by mouth daily. 09/11/20   [provider]  Levetiracetam 750 MG TB24 Take 750 mg by mouth in the morning and at bedtime. 09/06/20   [provider]  lidocaine  (LIDODERM ) 5 % Place 1 patch onto the skin daily. Remove & Discard patch within 12 hours or as directed by MD 05/20/23   Denese Finn, PA-C  lidocaine  (LIDODERM ) 5 % Place 1 patch onto the skin daily. Remove & Discard patch within 12 hours or as directed by MD 05/27/23   Maralee Senate, April, MD  lidocaine  (LIDODERM ) 5 % Place 1 patch onto the skin daily. Remove & Discard patch within 12 hours or as directed by MD 06/12/23   Maralee Senate, April, MD  losartan  (COZAAR ) 100 MG tablet Take 1 tablet (100 mg total) by mouth daily. 09/15/20 10/15/20  Aura Leeds Latif, DO  metFORMIN  (GLUCOPHAGE -XR) 500 MG 24 hr tablet Take 1,000 mg by mouth every morning. 06/18/20   [provider]  nitroGLYCERIN  (NITROSTAT ) 0.4 MG SL tablet Place 1 tablet (0.4 mg total) under the tongue every 5 (five) minutes as needed for chest  pain. 09/14/20   Aura Leeds Latif, DO  predniSONE  (DELTASONE ) 10 MG tablet Take 4 tablets (40 mg total) by mouth daily. 05/16/23   Kelsey Patricia, MD  senna-docusate (SENOKOT-S) 8.6-50 MG tablet Take 1 tablet by mouth at bedtime as needed for mild constipation. 09/14/20   Sheikh, Omair Latif, DO  VITAMIN A PO Take 1 tablet by mouth daily.    [provider]  VITAMIN E PO Take 1 tablet by mouth daily.    [provider]  zolmitriptan (ZOMIG) 5 MG tablet Take 5 mg by mouth daily as needed for migraine.    [provider]      Allergies    Penicillins and Metformin  and related    Review of Systems   Review of Systems  All other systems reviewed and are  negative.   Physical Exam Updated Vital Signs BP (!) 145/93 (BP Location: Right Arm)   Pulse 76   Temp 98 F (36.7 C) (Oral)   Resp 20   LMP 07/13/2011   SpO2 97%  Physical Exam Vitals and nursing note reviewed.  HENT:     Head: Normocephalic.  Pulmonary:     Effort: Pulmonary effort is normal.  Musculoskeletal:     Comments: There is tenderness to palpation in the soft tissues of the lumbar region.  No bony tenderness or step-off.  Strength is 5 out of 5 in both lower extremities.  Bilateral feet are tender to the dorsal aspect, but no significant swelling or deformity.  The left index finger is grossly normal in appearance.  There is no significant swelling or deformity.  She has good range of motion.  Skin:    General: Skin is warm and dry.  Neurological:     Mental Status: She is alert.     ED Results / Procedures / Treatments   Labs (all labs ordered are listed, but only abnormal results are displayed) Labs Reviewed - No data to display  EKG None  Radiology No results found.  Procedures Procedures    Medications Ordered in ED Medications - No data to display  ED Course/ Medical Decision Making/ A&P  X-rays were all negative for fracture.  This to be treated as contusions.  To follow-up as needed.  Final Clinical Impression(s) / ED Diagnoses Final diagnoses:  None    Rx / DC Orders ED Discharge Orders     None         Orvilla Blander, MD 06/21/23 [phone]

## 2023-06-21 NOTE — ED Provider Notes (Signed)
 [place] EMERGENCY DEPARTMENT AT [facility] Provider Note   CSN: [id number] Arrival date & time: 06/21/23  2224     History  Chief Complaint  Patient presents with   Ashley [name] is a 58 y.o. female presents today after falling off a treadmill yesterday.  Patient was seen and discharged home with over-the-counter pain control for low back pain.  Patient states Ashley [name] is leaving out of town tomorrow and the pain is too much.  Patient requesting stronger medication.  Patient ambulatory in triage.  Patient denies numbness, tingling, loss of bowel or bladder, fever, chills, or additional trauma.   Fall       Home Medications Prior to Admission medications   Medication Sig Start Date End Date Taking? Authorizing Provider  lidocaine  (LIDODERM ) 5 % Place 1 patch onto the skin daily. Remove & Discard patch within 12 hours or as directed by MD 06/21/23  Yes Rianna Lukes N, PA-C  methocarbamol  (ROBAXIN ) 500 MG tablet Take 1 tablet (500 mg total) by mouth 2 (two) times daily. 06/21/23  Yes Aylla Huffine N, PA-C  albuterol  (VENTOLIN  HFA) 108 (90 Base) MCG/ACT inhaler Inhale 2 puffs into the lungs every 6 (six) hours as needed for up to 10 days for wheezing or shortness of breath. 02/23/23 03/05/23  Acevedo, Tacha, PA  Ascorbic Acid (VITAMIN C PO) Take 1 tablet by mouth daily.    [provider]  atenolol (TENORMIN) 25 MG tablet Take 25 mg by mouth at bedtime. 07/05/20   [provider]  atorvastatin  (LIPITOR) 40 MG tablet Take 1 tablet (40 mg total) by mouth daily. 09/14/20   Aura Leeds Latif, DO  cetirizine  (ZYRTEC ) 10 MG chewable tablet Chew 1 tablet (10 mg total) by mouth daily. 11/26/22   Mayer, Jodi R, NP  Cyanocobalamin (VITAMIN B-12 PO) Take 1 tablet by mouth daily.    [provider]  diclofenac  Sodium (VOLTAREN ) 1 % GEL Apply 4 g topically 4 (four) times daily. Apply to affected areas 4 times daily as needed for pain. 09/19/21   Eloise Hake  Scales, PA-C  doxycycline  (VIBRAMYCIN ) 100 MG capsule Take 1 capsule (100 mg total) by mouth 2 (two) times daily. 05/16/23   Kelsey Patricia, MD  DULoxetine (CYMBALTA) 20 MG capsule Take 20 mg by mouth daily.    [provider]  esomeprazole (NEXIUM) 40 MG capsule Take 40 mg by mouth daily before breakfast.    [provider]  fluticasone  (FLONASE ) 50 MCG/ACT nasal spray Place 1 spray into both nostrils daily. 11/26/22   Mayer, Jodi R, NP  gabapentin (NEURONTIN) 300 MG capsule Take 300 mg by mouth 2 (two) times daily.    [provider]  hydrochlorothiazide (HYDRODIURIL) 25 MG tablet Take 25 mg by mouth daily. 09/11/20   [provider]  Levetiracetam 750 MG TB24 Take 750 mg by mouth in the morning and at bedtime. 09/06/20   [provider]  lidocaine  (LIDODERM ) 5 % Place 1 patch onto the skin daily. Remove & Discard patch within 12 hours or as directed by MD 05/20/23   Denese Finn, PA-C  lidocaine  (LIDODERM ) 5 % Place 1 patch onto the skin daily. Remove & Discard patch within 12 hours or as directed by MD 05/27/23   Maralee Senate, April, MD  lidocaine  (LIDODERM ) 5 % Place 1 patch onto the skin daily. Remove & Discard patch within 12 hours or as directed by MD 06/12/23   Maralee Senate, April, MD  losartan  (COZAAR ) 100 MG tablet Take 1 tablet (100 mg total) by mouth daily. 09/15/20 10/15/20  Sheikh, Omair Latif, DO  metFORMIN  (GLUCOPHAGE -XR) 500 MG 24 hr tablet Take 1,000 mg by mouth every morning. 06/18/20   [provider]  nitroGLYCERIN  (NITROSTAT ) 0.4 MG SL tablet Place 1 tablet (0.4 mg total) under the tongue every 5 (five) minutes as needed for chest pain. 09/14/20   Aura Leeds Latif, DO  predniSONE  (DELTASONE ) 10 MG tablet Take 4 tablets (40 mg total) by mouth daily. 05/16/23   Kelsey Patricia, MD  senna-docusate (SENOKOT-S) 8.6-50 MG tablet Take 1 tablet by mouth at bedtime as needed for mild constipation. 09/14/20   Sheikh, Omair Latif, DO  VITAMIN A PO  Take 1 tablet by mouth daily.    [provider]  VITAMIN E PO Take 1 tablet by mouth daily.    [provider]  zolmitriptan (ZOMIG) 5 MG tablet Take 5 mg by mouth daily as needed for migraine.    [provider]      Allergies    Penicillins and Metformin  and related    Review of Systems   Review of Systems  Musculoskeletal:  Positive for back pain.    Physical Exam Updated Vital Signs BP (!) 175/108 (BP Location: Left Arm)   Pulse 91   Temp 98 F (36.7 C) (Oral)   Resp 20   Ht 5\' 7"  (1.702 m)   Wt 96 kg   LMP 07/13/2011   SpO2 100%   BMI 33.15 kg/m  Physical Exam Vitals and nursing note reviewed.  Constitutional:      General: Ashley [name] is not in acute distress.    Appearance: Normal appearance. Ashley [name] is well-developed. Ashley [name] is not ill-appearing.  HENT:     Head: Normocephalic and atraumatic.     Right Ear: External ear normal.     Left Ear: External ear normal.     Nose: Nose normal.     Mouth/Throat:     Mouth: Mucous membranes are moist.     Pharynx: Oropharynx is clear.  Eyes:     Extraocular Movements: Extraocular movements intact.     Conjunctiva/sclera: Conjunctivae normal.  Cardiovascular:     Rate and Rhythm: Normal rate and regular rhythm.     Heart sounds: No murmur heard. Pulmonary:     Effort: Pulmonary effort is normal. No respiratory distress.     Breath sounds: Normal breath sounds.  Abdominal:     Palpations: Abdomen is soft.     Tenderness: There is no abdominal tenderness.  Musculoskeletal:        General: No swelling.     Cervical back: Normal range of motion and neck supple.     Comments: Mild tenderness to palpation of the paraspinal muscles in the lumbar spine region.  No step-offs, ecchymosis, midline tenderness, or deformity noted on exam.  Patient is neurovascularly intact with 5/5 strength in bilateral lower extremities.  Skin:    General: Skin is warm and dry.     Capillary Refill: Capillary refill takes less  than 2 seconds.  Neurological:     General: No focal deficit present.     Mental Status: Ashley [name] is alert and oriented to person, place, and time.     Cranial Nerves: No cranial nerve deficit.     Sensory: No sensory deficit.     Motor: No weakness.     Gait: Gait normal.  Psychiatric:        Mood and Affect:  Mood normal.     ED Results / Procedures / Treatments   Labs (all labs ordered are listed, but only abnormal results are displayed) Labs Reviewed - No data to display  EKG None  Radiology DG Foot Complete Right Result Date: 06/21/2023 EXAM: 3 OR MORE VIEW(S) XRAY OF THE RIGHT FOOT 06/21/2023 02:21:00 AM COMPARISON: None available. CLINICAL HISTORY: Fall. c/o falling off treadmill. Pt reports Ashley [name] was running fast and fell off. Pt reports Ashley [name] landed on back and twisted her feet. Pt reports bilateral feet pain, right index finger pain and back pain. FINDINGS: BONES AND JOINTS: Mild degenerative changes of the first MTP joint. No acute fracture. No focal osseous lesion. No joint dislocation. SOFT TISSUES: The soft tissues are unremarkable. IMPRESSION: 1. No acute fracture or dislocation. Electronically signed by: Zadie Herter MD 06/21/2023 02:36 AM EDT RP Workstation: [id number]   DG Foot Complete Left Result Date: 06/21/2023 EXAM: 3 OR MORE VIEW(S) XRAY OF THE LEFT FOOT 06/21/2023 02:21:00 AM COMPARISON: None available. CLINICAL HISTORY: Fall. Complains of falling off treadmill. Patient reports Ashley [name] was running fast and fell off. Patient reports Ashley [name] landed on back and twisted her feet. Patient reports bilateral feet pain, right index finger pain, and back pain. FINDINGS: BONES AND JOINTS: No acute fracture. No focal osseous lesion. No joint dislocation. SOFT TISSUES: The soft tissues are unremarkable. IMPRESSION: 1. No significant abnormality. Electronically signed by: Zadie Herter MD 06/21/2023 02:33 AM EDT RP Workstation: [id number]   DG Lumbar Spine Complete Result Date:  06/21/2023 EXAM: 4 VIEW(S) XRAY OF THE LUMBAR SPINE 06/21/2023 02:21:00 AM COMPARISON: 06/11/2023. CLINICAL HISTORY: Fall. C/o falling off treadmill. Pt reports Ashley [name] was running fast and fell off. Pt reports Ashley [name] landed on back and twisted her feet. Pt reports bilateral feet pain, right index finger pain and back pain. FINDINGS: LUMBAR SPINE: BONES: No acute fracture. No aggressive appearing osseous lesion. Alignment is normal. DISCS AND DEGENERATIVE CHANGES: Grade 1 anterolisthesis of L4 on L5 with mild degenerative changes. SOFT TISSUES: No acute abnormality. IMPRESSION: 1. No fracture is seen. Electronically signed by: Zadie Herter MD 06/21/2023 02:30 AM EDT RP Workstation: [id number]   DG Finger Index Right Result Date: 06/21/2023 EXAM: XRAY OF THE RIGHT FINGER(S) 06/21/2023 02:21:00 AM COMPARISON: None available. CLINICAL HISTORY: Injury. Complains of falling off treadmill. Patient reports Ashley [name] was running fast and fell off. Patient reports Ashley [name] landed on back and twisted her feet. Patient reports bilateral feet pain, right index finger pain, and back pain. FINDINGS: BONES AND JOINTS: No acute fracture. No focal osseous lesion. No joint dislocation. SOFT TISSUES: The soft tissues are unremarkable. IMPRESSION: 1. No acute abnormality. Electronically signed by: Zadie Herter MD 06/21/2023 02:28 AM EDT RP Workstation: [id number]    Procedures Procedures    Medications Ordered in ED Medications  lidocaine  (LIDODERM ) 5 % 2 patch (has no administration in time range)  methocarbamol  (ROBAXIN ) tablet 500 mg (has no administration in time range)    ED Course/ Medical Decision Making/ A&P                                 Medical Decision Making  This patient presents to the ED for concern of back pain after fall differential diagnosis includes musculoskeletal pain, L-spine injury  Medicines ordered and prescription drug management:  I ordered medication including Lidoderm  patch    I have  reviewed the patients home medicines and have made adjustments as needed  Problem List / ED Course: Consider for admission or further workup however patient's vital signs and physical exam are reassuring.  Given that the patient was imaged less than 24 hours ago and there has been no additional trauma I do not feel repeating imaging at this time would be beneficial or change disposition.  Patient given Lidoderm  patches and prescribed short course of muscle relaxers outpatient.  Patient given return precautions.  I feel patient is safe for discharge at this time.         Final Clinical Impression(s) / ED Diagnoses Final diagnoses:  Acute low back pain, unspecified back pain laterality, unspecified whether sciatica present    Rx / DC Orders ED Discharge Orders          Ordered    methocarbamol  (ROBAXIN ) 500 MG tablet  2 times daily        06/21/23 2302    lidocaine  (LIDODERM ) 5 %  Every 24 hours        06/21/23 2302              Carie Charity, PA-C 06/21/23 2304    Cheyenne Cotta, MD 06/22/23 1231

## 2023-06-21 NOTE — Discharge Instructions (Addendum)
Take ibuprofen 600 mg every 6 hours as needed for pain.  Follow-up with primary doctor if symptoms are not improving in the next week. 

## 2023-06-23 ENCOUNTER — Encounter (HOSPITAL_COMMUNITY): Payer: Self-pay | Admitting: Emergency Medicine

## 2023-06-23 ENCOUNTER — Emergency Department (HOSPITAL_COMMUNITY)

## 2023-06-23 ENCOUNTER — Emergency Department (HOSPITAL_COMMUNITY)
Admission: EM | Admit: 2023-06-23 | Discharge: 2023-06-24 | Disposition: A | Discharge status: Home / Self Care | Attending: Emergency Medicine | Admitting: Emergency Medicine

## 2023-06-23 DIAGNOSIS — M79601 Pain in right arm: Secondary | ICD-10-CM | POA: Diagnosis not present

## 2023-06-23 DIAGNOSIS — R079 Chest pain, unspecified: Secondary | ICD-10-CM | POA: Insufficient documentation

## 2023-06-23 DIAGNOSIS — R0602 Shortness of breath: Secondary | ICD-10-CM | POA: Diagnosis not present

## 2023-06-23 LAB — BASIC METABOLIC PANEL WITH GFR
Anion gap: 10 (ref 5–15)
BUN: 11 mg/dL (ref 6–20)
CO2: 22 mmol/L (ref 22–32)
Calcium: 8.7 mg/dL — ABNORMAL LOW (ref 8.9–10.3)
Chloride: 107 mmol/L (ref 98–111)
Creatinine, Ser: 0.97 mg/dL (ref 0.44–1.00)
GFR, Estimated: >60 mL/min (ref >60)
Glucose, Bld: 148 mg/dL — ABNORMAL HIGH (ref 70–99)
Potassium: 3.3 mmol/L — ABNORMAL LOW (ref 3.5–5.1)
Sodium: 139 mmol/L (ref 135–145)

## 2023-06-23 LAB — CBC
HCT: 38.6 % (ref 36.0–46.0)
Hemoglobin: 12.4 g/dL (ref 12.0–15.0)
MCH: 23 pg — ABNORMAL LOW (ref 26.0–34.0)
MCHC: 32.1 g/dL (ref 30.0–36.0)
MCV: 71.5 fL — ABNORMAL LOW (ref 80.0–100.0)
Platelets: 312 10*3/uL (ref 150–400)
RBC: 5.4 MIL/uL — ABNORMAL HIGH (ref 3.87–5.11)
RDW: 16.4 % — ABNORMAL HIGH (ref 11.5–15.5)
WBC: 6.9 10*3/uL (ref 4.0–10.5)
nRBC: 0 % (ref 0.0–0.2)

## 2023-06-23 NOTE — ED Triage Notes (Signed)
 Pt states she was leaving the gym around 915 and felt chest pain and SOB.

## 2023-06-24 ENCOUNTER — Emergency Department
Admission: EM | Admit: 2023-06-24 | Discharge: 2023-06-25 | Disposition: A | Discharge status: Home / Self Care | Attending: Emergency Medicine | Admitting: Emergency Medicine

## 2023-06-24 ENCOUNTER — Emergency Department

## 2023-06-24 ENCOUNTER — Other Ambulatory Visit: Payer: Self-pay

## 2023-06-24 DIAGNOSIS — R079 Chest pain, unspecified: Secondary | ICD-10-CM | POA: Diagnosis not present

## 2023-06-24 DIAGNOSIS — E119 Type 2 diabetes mellitus without complications: Secondary | ICD-10-CM | POA: Insufficient documentation

## 2023-06-24 DIAGNOSIS — I10 Essential (primary) hypertension: Secondary | ICD-10-CM | POA: Diagnosis not present

## 2023-06-24 DIAGNOSIS — M7989 Other specified soft tissue disorders: Secondary | ICD-10-CM | POA: Diagnosis present

## 2023-06-24 LAB — D-DIMER, QUANTITATIVE: D-Dimer, Quant: 0.46 ug{FEU}/mL (ref 0.00–0.50)

## 2023-06-24 LAB — CBC
HCT: 35.2 % — ABNORMAL LOW (ref 36.0–46.0)
Hemoglobin: 11.5 g/dL — ABNORMAL LOW (ref 12.0–15.0)
MCH: 23.1 pg — ABNORMAL LOW (ref 26.0–34.0)
MCHC: 32.7 g/dL (ref 30.0–36.0)
MCV: 70.8 fL — ABNORMAL LOW (ref 80.0–100.0)
Platelets: 316 K/uL (ref 150–400)
RBC: 4.97 MIL/uL (ref 3.87–5.11)
RDW: 16.5 % — ABNORMAL HIGH (ref 11.5–15.5)
WBC: 5.8 K/uL (ref 4.0–10.5)
nRBC: 0 % (ref 0.0–0.2)

## 2023-06-24 LAB — BASIC METABOLIC PANEL WITH GFR
Anion gap: 7 (ref 5–15)
BUN: 12 mg/dL (ref 6–20)
CO2: 22 mmol/L (ref 22–32)
Calcium: 8.5 mg/dL — ABNORMAL LOW (ref 8.9–10.3)
Chloride: 111 mmol/L (ref 98–111)
Creatinine, Ser: 0.78 mg/dL (ref 0.44–1.00)
GFR, Estimated: >60 mL/min (ref >60)
Glucose, Bld: 163 mg/dL — ABNORMAL HIGH (ref 70–99)
Potassium: 3.6 mmol/L (ref 3.5–5.1)
Sodium: 140 mmol/L (ref 135–145)

## 2023-06-24 LAB — TROPONIN I (HIGH SENSITIVITY)
Troponin I (High Sensitivity): 5 ng/L (ref <18)
Troponin I (High Sensitivity): 4 ng/L (ref <18)
Troponin I (High Sensitivity): 6 ng/L

## 2023-06-24 MED ORDER — ASPIRIN 81 MG PO CHEW
324.0000 mg | CHEWABLE_TABLET | Freq: Once | ORAL | Status: AC
  Administered 2023-06-24: 324 mg via ORAL
  Filled 2023-06-24: qty 4

## 2023-06-24 MED ORDER — OXYCODONE-ACETAMINOPHEN 5-325 MG PO TABS
1.0000 | ORAL_TABLET | Freq: Once | ORAL | Status: AC
  Administered 2023-06-24: 1 via ORAL
  Filled 2023-06-24: qty 1

## 2023-06-24 NOTE — ED Provider Notes (Signed)
 [facility] Provider Note    Event Date/Time   First MD Initiated Contact with Patient 06/24/23 2336     (approximate)   History   Chest Pain   HPI  Ashley [name] is a 58 y.o. female   Past medical history of hypertension and diabetes who presents to Emergency Department with chest pain that radiates to her right arm.  She also has leg swelling.  This has been ongoing for quite some time she was seen in the emergency department at outside hospital yesterday for her chest pain symptoms with negative troponins and D-dimer and presents with ongoing symptoms.  She denies any respiratory infectious symptoms or GI or GU complaints.  She has no other acute medical complaints.  She does have access to primary doctor who she sees regularly.  External Medical Documents Reviewed: Outside hospital notes from yesterday visit for chest pain radiating to the right arm with negative workup including D-dimer and troponins, also noted to have greater than 10 visits within the last couple of months to various ED's, including a visit earlier last month for leg swelling she is encouraged to use compression stockings and follow-up with her PCP.Aaron Aas      Physical Exam   Triage Vital Signs: ED Triage Vitals  Encounter Vitals Group     BP 06/24/23 2226 (!) 142/73     Systolic BP Percentile --      Diastolic BP Percentile --      Pulse Rate 06/24/23 2226 72     Resp 06/24/23 2226 17     Temp 06/24/23 2226 97.9 F (36.6 C)     Temp Source 06/24/23 2226 Oral     SpO2 06/24/23 2226 96 %     Weight 06/24/23 2222 211 lb (95.7 kg)     Height --      Head Circumference --      Peak Flow --      Pain Score 06/24/23 2221 9     Pain Loc --      Pain Education --      Exclude from Growth Chart --     Most recent vital signs: Vitals:   06/24/23 2226  BP: (!) 142/73  Pulse: 72  Resp: 17  Temp: 97.9 F (36.6 C)  SpO2: 96%    General: Awake, no distress.  CV:  Good  peripheral perfusion.  Resp:  Normal effort.  Abd:  No distention.  Other:  She has very minimal bilateral equal edema but without any infectious changes warmth or unilateral swelling.  Abdomen is soft and nontender heart sounds are normal rate and rhythm and her lungs are clear to auscultation.  She is comfortable appearing with slight hypertension otherwise vital signs normal, with a laptop in her lap watching the news.  Pleasant and conversive.   ED Results / Procedures / Treatments   Labs (all labs ordered are listed, but only abnormal results are displayed) Labs Reviewed  BASIC METABOLIC PANEL WITH GFR - Abnormal; Notable for the following components:      Result Value   Glucose, Bld 163 (*)    Calcium  8.5 (*)    All other components within normal limits  CBC - Abnormal; Notable for the following components:   Hemoglobin 11.5 (*)    HCT 35.2 (*)    MCV 70.8 (*)    MCH 23.1 (*)    RDW 16.5 (*)    All other components within normal limits  HEPATIC  FUNCTION PANEL - Abnormal; Notable for the following components:   Total Protein 6.3 (*)    Albumin 3.4 (*)    All other components within normal limits  LIPASE, BLOOD  TROPONIN I (HIGH SENSITIVITY)     I ordered and reviewed the above labs they are notable for cell counts electrolytes unremarkable initial troponin is negative.  EKG  ED ECG REPORT I, Buell Carmin, the attending physician, personally viewed and interpreted this ECG.   Date: 06/24/2023  EKG Time: 2226  Rate: 73  Rhythm: sinus  Axis: nl  Intervals:nl  ST&T Change: no stemi    RADIOLOGY I independently reviewed and interpreted chest x-ray I see no obvious focality pneumothorax I also reviewed radiologist's formal read.   PROCEDURES:  Critical Care performed: No  Procedures   MEDICATIONS ORDERED IN ED: Medications - No data to display   IMPRESSION / MDM / ASSESSMENT AND PLAN / ED COURSE  I reviewed the triage vital signs and the nursing notes.                                 Patient's presentation is most consistent with acute presentation with potential threat to life or bodily function.  Differential diagnosis includes, but is not limited to, ACS, PE, dissection, respiratory infection, pneumothorax, musculoskeletal pain, CHF, vascular insufficiency/lymphedema   The patient is on the cardiac monitor to evaluate for evidence of arrhythmia and/or significant heart rate changes.  MDM:    Ongoing vague chest pain with radiation of the right arm with negative troponins done serially yesterday and unchanged nonischemic EKG and initial troponin negative today.  I highly doubt ACS given nonspecific symptoms and negative troponins and given the chronicity of symptoms I will defer a second troponin.  I considered PE but I think less likely given no shortness of breath, no unilateral leg symptoms, and D-dimer obtained yesterday was normal.  She has bilateral leg swelling ongoing for quite some time and she states that this has been markedly better after using compression stockings during the day.  She will follow-up with her primary doctor regarding this issue.  I doubt CHF exacerbation with no shortness of breath and normal chest x-ray and normal lung exam.    I considered hospitalization for admission or observation however given her chronicity of symptoms, hemodynamic stability, unremarkable workup as above, I doubt cardiopulmonary emergency at this time and she can follow-up with her primary doctor and return with any new or worsening symptoms, manage as outpatient.        FINAL CLINICAL IMPRESSION(S) / ED DIAGNOSES   Final diagnoses:  Nonspecific chest pain  Leg swelling     Rx / DC Orders   ED Discharge Orders     None        Note:  This document was prepared using Dragon voice recognition software and may include unintentional dictation errors.    Buell Carmin, MD 06/25/23 [phone]

## 2023-06-24 NOTE — ED Triage Notes (Signed)
 Pt arrives with c/o CP that started yesterday. Per pt, pain radiates into her right arm. Pt endorses SOB and ankle swelling. Pt denies n/v. Pt seen for the same yesterday.

## 2023-06-24 NOTE — ED Provider Notes (Incomplete)
 [facility] Provider Note    Event Date/Time   First MD Initiated Contact with Patient 06/24/23 2336     (approximate)   History   Chest Pain   HPI  Ashley [name] is a 58 y.o. female   Past medical history of ***    Independent Historian contributed to assessment above: ***  External Medical Documents Reviewed: Outside hospital notes from yesterday visit for chest pain radiating to the right arm with negative workup including D-dimer and troponins, also noted to have greater than 10 visits within the last couple of months to various ED's, including a visit earlier last month for leg swelling she is encouraged to use compression stockings and follow-up with her PCP.Aaron Aas      Physical Exam   Triage Vital Signs: ED Triage Vitals  Encounter Vitals Group     BP 06/24/23 2226 (!) 142/73     Systolic BP Percentile --      Diastolic BP Percentile --      Pulse Rate 06/24/23 2226 72     Resp 06/24/23 2226 17     Temp 06/24/23 2226 97.9 F (36.6 C)     Temp Source 06/24/23 2226 Oral     SpO2 06/24/23 2226 96 %     Weight 06/24/23 2222 211 lb (95.7 kg)     Height --      Head Circumference --      Peak Flow --      Pain Score 06/24/23 2221 9     Pain Loc --      Pain Education --      Exclude from Growth Chart --     Most recent vital signs: Vitals:   06/24/23 2226  BP: (!) 142/73  Pulse: 72  Resp: 17  Temp: 97.9 F (36.6 C)  SpO2: 96%    General: Awake, no distress. *** CV:  Good peripheral perfusion. *** Resp:  Normal effort. *** Abd:  No distention. *** Other:  ***   ED Results / Procedures / Treatments   Labs (all labs ordered are listed, but only abnormal results are displayed) Labs Reviewed  BASIC METABOLIC PANEL WITH GFR - Abnormal; Notable for the following components:      Result Value   Glucose, Bld 163 (*)    Calcium  8.5 (*)    All other components within normal limits  CBC - Abnormal; Notable for the following  components:   Hemoglobin 11.5 (*)    HCT 35.2 (*)    MCV 70.8 (*)    MCH 23.1 (*)    RDW 16.5 (*)    All other components within normal limits  TROPONIN I (HIGH SENSITIVITY)  TROPONIN I (HIGH SENSITIVITY)     I ordered and reviewed the above labs they are notable for ***  EKG  ED ECG REPORT I, Buell Carmin, the attending physician, personally viewed and interpreted this ECG.   Date: 06/24/2023  EKG Time: ***  Rate: ***  Rhythm: {ekg findings:315101}  Axis: ***  Intervals:{conduction defects:17367}  ST&T Change: ***    RADIOLOGY I independently reviewed and interpreted *** I also reviewed radiologist's formal read.   PROCEDURES:  Critical Care performed: {CriticalCareYesNo:19197::"Yes, see critical care procedure note(s)","No"}  Procedures   MEDICATIONS ORDERED IN ED: Medications - No data to display  External physician / consultants:  I spoke with *** regarding care plan for this patient.   IMPRESSION / MDM / ASSESSMENT AND PLAN / ED COURSE  I reviewed the triage vital signs and the nursing notes.                                Patient's presentation is most consistent with {EM COPA:27473}  Differential diagnosis includes, but is not limited to, ***   ***The patient is on the cardiac monitor to evaluate for evidence of arrhythmia and/or significant heart rate changes.  MDM:  ***  I considered hospitalization for admission or observation ***        FINAL CLINICAL IMPRESSION(S) / ED DIAGNOSES   Final diagnoses:  None     Rx / DC Orders   ED Discharge Orders     None        Note:  This document was prepared using Dragon voice recognition software and may include unintentional dictation errors.

## 2023-06-24 NOTE — ED Provider Notes (Signed)
 [place] EMERGENCY DEPARTMENT AT [facility] Provider Note   CSN: [id number] Arrival date & time: 06/23/23  2250     History  Chief Complaint  Patient presents with   Chest Pain    Ashley [name] is a 58 y.o. female.  58 yo F here with chest pain and arm pain.  States that she was at the gym doing some type of lower body exercising abs when she stood up she had some central chest pain that felt like a foot on her chest but then started have some right arm pain.  She states that both these are new.  She has some shortness of breath with that as well.  No history of blood clots.  She states that it has improved slightly but still there so she came in for evaluation.  Of note patient was sleeping when I first examined her and had to wake her up for her to tell me about her severe discomfort.  No lower extremity swelling but she states that she does have it once a while.  She has no pain in her legs.  She is no pain in her left arm.  No other associated symptoms.  She states she has had similar symptoms in the past with no definitive cause to be found.   Chest Pain      Home Medications Prior to Admission medications   Medication Sig Start Date End Date Taking? Authorizing Provider  albuterol  (VENTOLIN  HFA) 108 (90 Base) MCG/ACT inhaler Inhale 2 puffs into the lungs every 6 (six) hours as needed for up to 10 days for wheezing or shortness of breath. 02/23/23 03/05/23  Acevedo, Blossie, PA  Ascorbic Acid (VITAMIN C PO) Take 1 tablet by mouth daily.    [provider]  atenolol (TENORMIN) 25 MG tablet Take 25 mg by mouth at bedtime. 07/05/20   [provider]  atorvastatin  (LIPITOR) 40 MG tablet Take 1 tablet (40 mg total) by mouth daily. 09/14/20   Aura Leeds Latif, DO  cetirizine  (ZYRTEC ) 10 MG chewable tablet Chew 1 tablet (10 mg total) by mouth daily. 11/26/22   Mayer, Jodi R, NP  Cyanocobalamin (VITAMIN B-12 PO) Take 1 tablet by mouth daily.    [provider]  diclofenac  Sodium (VOLTAREN ) 1 % GEL Apply 4 g topically 4 (four) times daily. Apply to affected areas 4 times daily as needed for pain. 09/19/21   Eloise Hake Scales, PA-C  doxycycline  (VIBRAMYCIN ) 100 MG capsule Take 1 capsule (100 mg total) by mouth 2 (two) times daily. 05/16/23   Kelsey Patricia, MD  DULoxetine (CYMBALTA) 20 MG capsule Take 20 mg by mouth daily.    [provider]  esomeprazole (NEXIUM) 40 MG capsule Take 40 mg by mouth daily before breakfast.    [provider]  fluticasone  (FLONASE ) 50 MCG/ACT nasal spray Place 1 spray into both nostrils daily. 11/26/22   Mayer, Jodi R, NP  gabapentin (NEURONTIN) 300 MG capsule Take 300 mg by mouth 2 (two) times daily.    [provider]  hydrochlorothiazide (HYDRODIURIL) 25 MG tablet Take 25 mg by mouth daily. 09/11/20   [provider]  Levetiracetam 750 MG TB24 Take 750 mg by mouth in the morning and at bedtime. 09/06/20   [provider]  lidocaine  (LIDODERM ) 5 % Place 1 patch onto the skin daily. Remove & Discard patch within 12 hours or as directed by MD 05/20/23   Denese Finn, PA-C  lidocaine  (LIDODERM )  5 % Place 1 patch onto the skin daily. Remove & Discard patch within 12 hours or as directed by MD 05/27/23   Maralee Senate, April, MD  lidocaine  (LIDODERM ) 5 % Place 1 patch onto the skin daily. Remove & Discard patch within 12 hours or as directed by MD 06/12/23   Maralee Senate, April, MD  lidocaine  (LIDODERM ) 5 % Place 1 patch onto the skin daily. Remove & Discard patch within 12 hours or as directed by MD 06/21/23   Keith, Kayla N, PA-C  losartan  (COZAAR ) 100 MG tablet Take 1 tablet (100 mg total) by mouth daily. 09/15/20 10/15/20  Aura Leeds Latif, DO  metFORMIN  (GLUCOPHAGE -XR) 500 MG 24 hr tablet Take 1,000 mg by mouth every morning. 06/18/20   [provider]  methocarbamol  (ROBAXIN ) 500 MG tablet Take 1 tablet (500 mg total) by mouth 2 (two) times daily. 06/21/23   Keith, Kayla  N, PA-C  nitroGLYCERIN  (NITROSTAT ) 0.4 MG SL tablet Place 1 tablet (0.4 mg total) under the tongue every 5 (five) minutes as needed for chest pain. 09/14/20   Aura Leeds Latif, DO  predniSONE  (DELTASONE ) 10 MG tablet Take 4 tablets (40 mg total) by mouth daily. 05/16/23   Kelsey Patricia, MD  senna-docusate (SENOKOT-S) 8.6-50 MG tablet Take 1 tablet by mouth at bedtime as needed for mild constipation. 09/14/20   Sheikh, Omair Latif, DO  VITAMIN A PO Take 1 tablet by mouth daily.    [provider]  VITAMIN E PO Take 1 tablet by mouth daily.    [provider]  zolmitriptan (ZOMIG) 5 MG tablet Take 5 mg by mouth daily as needed for migraine.    [provider]      Allergies    Penicillins and Metformin  and related    Review of Systems   Review of Systems  Cardiovascular:  Positive for chest pain.    Physical Exam Updated Vital Signs BP 136/80 (BP Location: Right Arm)   Pulse 70   Temp 98.1 F (36.7 C)   Resp 18   LMP 07/13/2011   SpO2 99%  Physical Exam Vitals and nursing note reviewed.  Constitutional:      Appearance: She is well-developed.  HENT:     Head: Normocephalic and atraumatic.  Cardiovascular:     Rate and Rhythm: Normal rate and regular rhythm.  Pulmonary:     Effort: No respiratory distress.     Breath sounds: No stridor.  Abdominal:     General: There is no distension or abdominal bruit.     Palpations: Abdomen is soft.  Musculoskeletal:        General: Normal range of motion.     Cervical back: Normal range of motion.     Right lower leg: No edema.     Left lower leg: No edema.  Neurological:     Mental Status: She is alert.     ED Results / Procedures / Treatments   Labs (all labs ordered are listed, but only abnormal results are displayed) Labs Reviewed  BASIC METABOLIC PANEL WITH GFR - Abnormal; Notable for the following components:      Result Value   Potassium 3.3 (*)    Glucose, Bld 148 (*)    Calcium  8.7 (*)     All other components within normal limits  CBC - Abnormal; Notable for the following components:   RBC 5.40 (*)    MCV 71.5 (*)    MCH 23.0 (*)    RDW 16.4 (*)  All other components within normal limits  D-DIMER, QUANTITATIVE  TROPONIN I (HIGH SENSITIVITY)  TROPONIN I (HIGH SENSITIVITY)    EKG EKG Interpretation Date/Time:  Tuesday June 23 2023 23:03:02 EDT Ventricular Rate:  90 PR Interval:  152 QRS Duration:  78 QT Interval:  368 QTC Calculation: 450 R Axis:   116  Text Interpretation:  Suspect arm lead reversal, interpretation assumes no reversal Normal sinus rhythm Left posterior fascicular block Cannot rule out Anterior infarct , age undetermined Abnormal ECG When compared with ECG of 27-May-2023 22:28, Arm lead reversal is now present Confirmed by Alissa April ([id number]) on 06/23/2023 11:33:23 PM  Radiology DG Chest 2 View Result Date: 06/23/2023 CLINICAL DATA:  Chest pain EXAM: CHEST - 2 VIEW COMPARISON:  05/27/2023 FINDINGS: Normal heart size and pulmonary vascularity. No focal airspace disease or consolidation in the lungs. No blunting of costophrenic angles. No pneumothorax. Mediastinal contours appear intact. Degenerative changes in the spine. IMPRESSION: No active cardiopulmonary disease. Electronically Signed   By: Boyce Byes M.D.   On: 06/23/2023 23:37    Procedures Procedures    Medications Ordered in ED Medications  oxyCODONE -acetaminophen  (PERCOCET/ROXICET) 5-325 MG per tablet 1 tablet (1 tablet Oral Given 06/24/23 0251)  aspirin  chewable tablet 324 mg (324 mg Oral Given 06/24/23 0250)    ED Course/ Medical Decision Making/ A&P                                 Medical Decision Making Amount and/or Complexity of Data Reviewed Labs: ordered. Radiology: ordered. ECG/medicine tests: ordered.  Risk OTC drugs. Prescription drug management.  Likely reversed arm leads, will repeat ecg. Pending second trop and d dimer but overall patient is frequent  utilizer of the ED for various pain complaints including what she is having now so is likely MSK but will rule out ACS/PE.  ACS/PE ruled out with labs. No indication for further workup. Stable for d/c.   Final Clinical Impression(s) / ED Diagnoses Final diagnoses:  Nonspecific chest pain    Rx / DC Orders ED Discharge Orders     None         Jazmen Lindenbaum, Reymundo Caulk, MD 06/24/23 [phone]

## 2023-06-25 LAB — LIPASE, BLOOD: Lipase: 31 U/L (ref 11–51)

## 2023-06-25 LAB — HEPATIC FUNCTION PANEL
ALT: 21 U/L (ref 0–44)
AST: 30 U/L (ref 15–41)
Albumin: 3.4 g/dL — ABNORMAL LOW (ref 3.5–5.0)
Alkaline Phosphatase: 121 U/L (ref 38–126)
Bilirubin, Direct: <0.1 mg/dL (ref 0.0–0.2)
Total Bilirubin: 1 mg/dL (ref 0.0–1.2)
Total Protein: 6.3 g/dL — ABNORMAL LOW (ref 6.5–8.1)

## 2023-06-25 NOTE — Discharge Instructions (Signed)
 Fortunately your evaluation in the emergency department tonight did not reveal any life-threatening conditions that account for your symptoms.  Continue using compression stockings for your leg swelling.  Follow-up with Dr. Authur Leghorn this week.  Thank you for choosing us  for your health care today!  Please see your primary doctor this week for a follow up appointment.   If you have any new, worsening, or unexpected symptoms call your doctor right away or come back to the emergency department for reevaluation.  It was my pleasure to care for you today.   Arron Large Margery Sheets, MD

## 2023-06-30 ENCOUNTER — Encounter (HOSPITAL_COMMUNITY): Payer: Self-pay

## 2023-06-30 ENCOUNTER — Emergency Department (HOSPITAL_COMMUNITY)
Admission: EM | Admit: 2023-06-30 | Discharge: 2023-07-01 | Disposition: A | Discharge status: Home / Self Care | Attending: Emergency Medicine | Admitting: Emergency Medicine

## 2023-06-30 ENCOUNTER — Other Ambulatory Visit: Payer: Self-pay

## 2023-06-30 DIAGNOSIS — E119 Type 2 diabetes mellitus without complications: Secondary | ICD-10-CM | POA: Insufficient documentation

## 2023-06-30 DIAGNOSIS — M79671 Pain in right foot: Secondary | ICD-10-CM

## 2023-06-30 DIAGNOSIS — I10 Essential (primary) hypertension: Secondary | ICD-10-CM | POA: Insufficient documentation

## 2023-06-30 DIAGNOSIS — Z79899 Other long term (current) drug therapy: Secondary | ICD-10-CM | POA: Insufficient documentation

## 2023-06-30 DIAGNOSIS — Z7984 Long term (current) use of oral hypoglycemic drugs: Secondary | ICD-10-CM | POA: Insufficient documentation

## 2023-06-30 DIAGNOSIS — M79672 Pain in left foot: Secondary | ICD-10-CM | POA: Diagnosis not present

## 2023-06-30 NOTE — Discharge Instructions (Signed)
 It was a pleasure taking part in your care.  As discussed, please follow-up with your PCP for evaluation and possible up titration of gabapentin.  Please continue to practice good foot hygiene, change socks daily.  Take ibuprofen  and Tylenol  for pain.  Return to the ED with any new or worsening symptoms.

## 2023-06-30 NOTE — ED Provider Notes (Signed)
 [place] EMERGENCY DEPARTMENT AT [facility] Provider Note   CSN: [id number] Arrival date & time: 06/30/23  2312     History  Chief Complaint  Patient presents with   Foot Pain    Ashley [name] is a 58 y.o. female with history of hypertension, GERD, eczema, anemia, type 2 diabetes.  Patient presents to ED for evaluation of blisters to bilateral feet as well as tingling sensation of bilateral feet.  Reports that this sensation of tingling has been ongoing for the last 3 weeks.  She reports that she is a diabetic and takes gabapentin.  She denies any trauma to her feet.  Denies any polyuria, polydipsia, polyphagia.  She is also here stating that she has blisters on her bilateral feet.  She reports that these blisters have occurred because she has been recently wearing compression stockings.  She denies any drainage from these blisters.  She denies any overlying skin change to her feet.  She denies any fevers and nausea or vomiting.   Foot Pain       Home Medications Prior to Admission medications   Medication Sig Start Date End Date Taking? Authorizing Provider  albuterol  (VENTOLIN  HFA) 108 (90 Base) MCG/ACT inhaler Inhale 2 puffs into the lungs every 6 (six) hours as needed for up to 10 days for wheezing or shortness of breath. 02/23/23 03/05/23  Acevedo, Najia, PA  Ascorbic Acid (VITAMIN C PO) Take 1 tablet by mouth daily.    [provider]  atenolol (TENORMIN) 25 MG tablet Take 25 mg by mouth at bedtime. 07/05/20   [provider]  atorvastatin  (LIPITOR) 40 MG tablet Take 1 tablet (40 mg total) by mouth daily. 09/14/20   Aura Leeds Latif, DO  cetirizine  (ZYRTEC ) 10 MG chewable tablet Chew 1 tablet (10 mg total) by mouth daily. 11/26/22   Mayer, Jodi R, NP  Cyanocobalamin (VITAMIN B-12 PO) Take 1 tablet by mouth daily.    [provider]  diclofenac  Sodium (VOLTAREN ) 1 % GEL Apply 4 g topically 4 (four) times daily. Apply to affected areas 4  times daily as needed for pain. 09/19/21   Eloise Hake Scales, PA-C  doxycycline  (VIBRAMYCIN ) 100 MG capsule Take 1 capsule (100 mg total) by mouth 2 (two) times daily. 05/16/23   Kelsey Patricia, MD  DULoxetine (CYMBALTA) 20 MG capsule Take 20 mg by mouth daily.    [provider]  esomeprazole (NEXIUM) 40 MG capsule Take 40 mg by mouth daily before breakfast.    [provider]  fluticasone  (FLONASE ) 50 MCG/ACT nasal spray Place 1 spray into both nostrils daily. 11/26/22   Mayer, Jodi R, NP  gabapentin (NEURONTIN) 300 MG capsule Take 300 mg by mouth 2 (two) times daily.    [provider]  hydrochlorothiazide (HYDRODIURIL) 25 MG tablet Take 25 mg by mouth daily. 09/11/20   [provider]  Levetiracetam 750 MG TB24 Take 750 mg by mouth in the morning and at bedtime. 09/06/20   [provider]  lidocaine  (LIDODERM ) 5 % Place 1 patch onto the skin daily. Remove & Discard patch within 12 hours or as directed by MD 05/20/23   Aleatha Hunting T, PA-C  lidocaine  (LIDODERM ) 5 % Place 1 patch onto the skin daily. Remove & Discard patch within 12 hours or as directed by MD 05/27/23   Maralee Senate, April, MD  lidocaine  (LIDODERM ) 5 % Place 1 patch onto the skin daily. Remove & Discard patch within 12 hours or as directed  by MD 06/12/23   Maralee Senate, April, MD  lidocaine  (LIDODERM ) 5 % Place 1 patch onto the skin daily. Remove & Discard patch within 12 hours or as directed by MD 06/21/23   Keith, Kayla N, PA-C  losartan  (COZAAR ) 100 MG tablet Take 1 tablet (100 mg total) by mouth daily. 09/15/20 10/15/20  Aura Leeds Latif, DO  metFORMIN  (GLUCOPHAGE -XR) 500 MG 24 hr tablet Take 1,000 mg by mouth every morning. 06/18/20   [provider]  methocarbamol  (ROBAXIN ) 500 MG tablet Take 1 tablet (500 mg total) by mouth 2 (two) times daily. 06/21/23   Keith, Kayla N, PA-C  nitroGLYCERIN  (NITROSTAT ) 0.4 MG SL tablet Place 1 tablet (0.4 mg total) under the tongue every 5 (five) minutes  as needed for chest pain. 09/14/20   Aura Leeds Latif, DO  predniSONE  (DELTASONE ) 10 MG tablet Take 4 tablets (40 mg total) by mouth daily. 05/16/23   Kelsey Patricia, MD  senna-docusate (SENOKOT-S) 8.6-50 MG tablet Take 1 tablet by mouth at bedtime as needed for mild constipation. 09/14/20   Sheikh, Omair Latif, DO  VITAMIN A PO Take 1 tablet by mouth daily.    [provider]  VITAMIN E PO Take 1 tablet by mouth daily.    [provider]  zolmitriptan (ZOMIG) 5 MG tablet Take 5 mg by mouth daily as needed for migraine.    [provider]      Allergies    Penicillins and Metformin  and related    Review of Systems   Review of Systems  All other systems reviewed and are negative.   Physical Exam Updated Vital Signs BP 121/67 (BP Location: Right Arm)   Pulse (!) 102   Temp 97.7 F (36.5 C)   Resp 17   Ht 5\' 7"  (1.702 m)   Wt 95.3 kg   LMP 07/13/2011   SpO2 97%   BMI 32.89 kg/m  Physical Exam Vitals and nursing note reviewed.  Constitutional:      General: She is not in acute distress.    Appearance: She is well-developed.  HENT:     Head: Normocephalic and atraumatic.  Eyes:     Conjunctiva/sclera: Conjunctivae normal.  Cardiovascular:     Rate and Rhythm: Normal rate and regular rhythm.     Heart sounds: No murmur heard.    Comments: 2+ DP pulse in the bilateral lower extremities Pulmonary:     Effort: Pulmonary effort is normal. No respiratory distress.     Breath sounds: Normal breath sounds.  Abdominal:     Palpations: Abdomen is soft.     Tenderness: There is no abdominal tenderness.  Musculoskeletal:        General: No swelling.     Cervical back: Neck supple.  Skin:    General: Skin is warm and dry.     Capillary Refill: Capillary refill takes less than 2 seconds.     Comments: No signs of skin breakdown, erythema, swelling to patient bilateral feet  Neurological:     Mental Status: She is alert.  Psychiatric:        Mood and  Affect: Mood normal.        ED Results / Procedures / Treatments   Labs (all labs ordered are listed, but only abnormal results are displayed) Labs Reviewed - No data to display  EKG None  Radiology No results found.  Procedures Procedures   Medications Ordered in ED Medications - No data to display  ED Course/ Medical Decision  Making/ A&P   Medical Decision Making  58 year old female presents for evaluation.  Please see HPI for further details.  On examination the patient is afebrile, well-appearing.  Neurological examination is at baseline.  Abdomen soft and compressible.  Lung sounds clear bilaterally, nonhypoxic.  Patient feet examined.  No signs of skin breakdown to bilateral feet.  No signs of erythema, swelling.  Please see photos attached.  Patient complaining of tingling in bilateral feet.  Reports he is a diabetic and she takes gabapentin.  At this time, patient work up unremarkable.  No emergent condition in need of further workup at this time.  Patient will be referred back to her PCP for possible up titration of gabapentin and further management and care of her diabetes.  She was advised to take ibuprofen  or Tylenol  for pain.  Encouraged to continue with good foot hygiene, changing socks daily.  Advised to follow-up with PCP.  Encouraged to return to the ED with any new or worsening symptoms and she voiced understanding.  Stable to discharge home.   Final Clinical Impression(s) / ED Diagnoses Final diagnoses:  Pain in both feet    Rx / DC Orders ED Discharge Orders     None         Adel Aden, PA-C 06/30/23 2353

## 2023-06-30 NOTE — ED Triage Notes (Signed)
 Pt reports blisters to the bottom of her toes x one week that she states is from wearing compression stockings.

## 2023-07-01 NOTE — ED Notes (Signed)
 Patient discharged in stable condition, education materials explained including, follow up, any prescriptions and reasons to return. Patient voiced agreement to education and discharge material.

## 2023-07-05 ENCOUNTER — Other Ambulatory Visit: Payer: Self-pay

## 2023-07-05 ENCOUNTER — Emergency Department (HOSPITAL_BASED_OUTPATIENT_CLINIC_OR_DEPARTMENT_OTHER)
Admission: EM | Admit: 2023-07-05 | Discharge: 2023-07-06 | Disposition: A | Discharge status: Home / Self Care | Attending: Emergency Medicine | Admitting: Emergency Medicine

## 2023-07-05 DIAGNOSIS — M25519 Pain in unspecified shoulder: Secondary | ICD-10-CM | POA: Insufficient documentation

## 2023-07-05 DIAGNOSIS — Z79899 Other long term (current) drug therapy: Secondary | ICD-10-CM | POA: Diagnosis not present

## 2023-07-05 DIAGNOSIS — I10 Essential (primary) hypertension: Secondary | ICD-10-CM | POA: Insufficient documentation

## 2023-07-05 DIAGNOSIS — Z7984 Long term (current) use of oral hypoglycemic drugs: Secondary | ICD-10-CM | POA: Insufficient documentation

## 2023-07-05 DIAGNOSIS — E119 Type 2 diabetes mellitus without complications: Secondary | ICD-10-CM | POA: Insufficient documentation

## 2023-07-05 DIAGNOSIS — R0602 Shortness of breath: Secondary | ICD-10-CM | POA: Insufficient documentation

## 2023-07-05 DIAGNOSIS — Z7982 Long term (current) use of aspirin: Secondary | ICD-10-CM | POA: Insufficient documentation

## 2023-07-05 DIAGNOSIS — R110 Nausea: Secondary | ICD-10-CM | POA: Insufficient documentation

## 2023-07-05 LAB — URINALYSIS, ROUTINE W REFLEX MICROSCOPIC
Bilirubin Urine: NEGATIVE
Glucose, UA: NEGATIVE mg/dL
Hgb urine dipstick: NEGATIVE
Ketones, ur: NEGATIVE mg/dL
Leukocytes,Ua: NEGATIVE
Nitrite: NEGATIVE
Protein, ur: NEGATIVE mg/dL
Specific Gravity, Urine: 1.007 (ref 1.005–1.030)
pH: 5 (ref 5.0–8.0)

## 2023-07-05 LAB — COMPREHENSIVE METABOLIC PANEL WITH GFR
ALT: 19 U/L (ref 0–44)
AST: 34 U/L (ref 15–41)
Albumin: 3.7 g/dL (ref 3.5–5.0)
Alkaline Phosphatase: 151 U/L — ABNORMAL HIGH (ref 38–126)
Anion gap: 15 (ref 5–15)
BUN: 13 mg/dL (ref 6–20)
CO2: 19 mmol/L — ABNORMAL LOW (ref 22–32)
Calcium: 8.8 mg/dL — ABNORMAL LOW (ref 8.9–10.3)
Chloride: 104 mmol/L (ref 98–111)
Creatinine, Ser: 1.09 mg/dL — ABNORMAL HIGH (ref 0.44–1.00)
GFR, Estimated: 59 mL/min — ABNORMAL LOW (ref >60)
Glucose, Bld: 177 mg/dL — ABNORMAL HIGH (ref 70–99)
Potassium: 3.6 mmol/L (ref 3.5–5.1)
Sodium: 138 mmol/L (ref 135–145)
Total Bilirubin: 0.4 mg/dL (ref 0.0–1.2)
Total Protein: 6.5 g/dL (ref 6.5–8.1)

## 2023-07-05 LAB — CBC
HCT: 38.4 % (ref 36.0–46.0)
Hemoglobin: 12.7 g/dL (ref 12.0–15.0)
MCH: 23 pg — ABNORMAL LOW (ref 26.0–34.0)
MCHC: 33.1 g/dL (ref 30.0–36.0)
MCV: 69.6 fL — ABNORMAL LOW (ref 80.0–100.0)
Platelets: 313 10*3/uL (ref 150–400)
RBC: 5.52 MIL/uL — ABNORMAL HIGH (ref 3.87–5.11)
RDW: 16 % — ABNORMAL HIGH (ref 11.5–15.5)
WBC: 8.2 10*3/uL (ref 4.0–10.5)
nRBC: 0 % (ref 0.0–0.2)

## 2023-07-05 LAB — TROPONIN T, HIGH SENSITIVITY: Troponin T High Sensitivity: <15 ng/L (ref <19)

## 2023-07-05 LAB — LIPASE, BLOOD: Lipase: 26 U/L (ref 11–51)

## 2023-07-05 MED ORDER — ASPIRIN 81 MG PO CHEW
324.0000 mg | CHEWABLE_TABLET | Freq: Once | ORAL | Status: AC
  Administered 2023-07-05: 324 mg via ORAL
  Filled 2023-07-05: qty 4

## 2023-07-05 NOTE — ED Provider Notes (Signed)
 11:52 PM Assumed care from Clovia Dapper PA and Dr. Leida Puna, please see their note for full history, physical and decision making until this point. In brief this is a 58 y.o. year old female who presented to the ED tonight with No chief complaint on file.     On [place] bus from the gym and got nauesous. Then light headed, dyspneic, diaphoretic. Had to sit down. Pending repeat troponin and disposition.   Second troponin is good. BP improved. Feels improved. Stable for d/c.   Of note, last time I saw this patient on 6/5 I received an inbasket message from [facility] stating she is well known to them and is homeless. She often stays overnight and showers in the gym but when that doesn't work out she will come to the ED for evaluation. They have their SW's working on it however I will put in a consult for [facility] here as well to see if there's anything they can do.   Discharge instructions, including strict return precautions for new or worsening symptoms, given. Patient and/or family verbalized understanding and agreement with the plan as described.   Labs, studies and imaging reviewed by myself and considered in medical decision making if ordered. Imaging interpreted by radiology.  Labs Reviewed  COMPREHENSIVE METABOLIC PANEL WITH GFR - Abnormal; Notable for the following components:      Result Value   CO2 19 (*)    Glucose, Bld 177 (*)    Creatinine, Ser 1.09 (*)    Calcium  8.8 (*)    Alkaline Phosphatase 151 (*)    GFR, Estimated 59 (*)    All other components within normal limits  CBC - Abnormal; Notable for the following components:   RBC 5.52 (*)    MCV 69.6 (*)    MCH 23.0 (*)    RDW 16.0 (*)    All other components within normal limits  URINALYSIS, ROUTINE W REFLEX MICROSCOPIC - Abnormal; Notable for the following components:   Color, Urine COLORLESS (*)    All other components within normal limits  LIPASE, BLOOD  TROPONIN T, HIGH SENSITIVITY  TROPONIN T, HIGH SENSITIVITY    No orders  to display    No follow-ups on file.    Mayford Alberg, Reymundo Caulk, MD 07/06/23 [phone]
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
## 2023-07-05 NOTE — ED Triage Notes (Signed)
 Pt POV reporting R shoulder pain after exercising tonight. Went to the store after and became lightheaded and nauseous. Denies CP/SOB.

## 2023-07-06 LAB — TROPONIN T, HIGH SENSITIVITY: Troponin T High Sensitivity: <15 ng/L (ref <19)

## 2023-07-06 MED ORDER — SODIUM CHLORIDE 0.9 % IV BOLUS
1000.0000 mL | Freq: Once | INTRAVENOUS | Status: AC
  Administered 2023-07-06: 1000 mL via INTRAVENOUS

## 2023-08-05 ENCOUNTER — Other Ambulatory Visit: Payer: Self-pay

## 2023-08-05 ENCOUNTER — Encounter: Payer: Self-pay | Admitting: Podiatry

## 2023-08-05 ENCOUNTER — Ambulatory Visit (INDEPENDENT_AMBULATORY_CARE_PROVIDER_SITE_OTHER): Admitting: Podiatry

## 2023-08-05 VITALS — Ht 67.0 in | Wt 210.0 lb

## 2023-08-05 DIAGNOSIS — B353 Tinea pedis: Secondary | ICD-10-CM

## 2023-08-05 DIAGNOSIS — B351 Tinea unguium: Secondary | ICD-10-CM | POA: Diagnosis not present

## 2023-08-05 DIAGNOSIS — M79674 Pain in right toe(s): Secondary | ICD-10-CM | POA: Diagnosis not present

## 2023-08-05 DIAGNOSIS — E119 Type 2 diabetes mellitus without complications: Secondary | ICD-10-CM

## 2023-08-05 DIAGNOSIS — M79675 Pain in left toe(s): Secondary | ICD-10-CM

## 2023-08-05 MED ORDER — KETOCONAZOLE 2 % EX CREA
1.0000 | TOPICAL_CREAM | Freq: Two times a day (BID) | CUTANEOUS | 2 refills | Status: DC
  Filled 2023-08-05: qty 60, 30d supply, fill #0
  Filled 2023-09-04: qty 60, 30d supply, fill #1
  Filled 2023-10-07: qty 60, 30d supply, fill #2

## 2023-08-05 NOTE — Progress Notes (Signed)
  Subjective:  Patient ID: Ashley [name], female    DOB: 1965/11/24,  MRN: [id number]  Chief Complaint  Patient presents with   Callouses    Rm 4 Patient is here for nail trimming,and ankle and leg swelling. Patient states there is knot between the 4th and fifth right toe. Patient is currently using compression socks for leg and ankle swelling. Patient states primary care doctor has adjusted medication to help with fluid retention in her lower extremities. Patient is requesting Paralink application completion.    58 y.o. female presents with the above complaint. History confirmed with patient.  Has itching peeling skin between the toes as well  Objective:  Physical Exam: warm, good capillary refill, no trophic changes or ulcerative lesions, normal DP and PT pulses, normal sensory exam, tinea pedis, and mild peripheral edema smooth pain-free range of motion of joints. Left Foot: dystrophic yellowed discolored nail plates with subungual debris Right Foot: dystrophic yellowed discolored nail plates with subungual debris  Assessment:   1. Pain due to onychomycosis of toenails of both feet   2. Tinea pedis of both feet   3. Encounter for diabetic foot exam [facility])      Plan:  Patient was evaluated and treated and all questions answered.  Patient educated on diabetes. Discussed proper diabetic foot care and discussed risks and complications of disease. Educated patient in depth on reasons to return to the office immediately should he/she discover anything concerning or new on the feet. All questions answered. Discussed proper shoes as well.   Discussed the etiology and treatment options for the condition in detail with the patient. Recommended debridement of the nails today. Sharp and mechanical debridement performed of all painful and mycotic nails today. Nails debrided in length and thickness using a nail nipper to level of comfort. Follow up as needed for painful nails.  Discussed the  etiology and treatment options for tinea pedis.  Discussed topical and oral treatment.  Recommended topical treatment with 2% ketoconazole  cream.  This was sent to the patient's pharmacy.  Also discussed appropriate foot hygiene, use of antifungal spray such as Tinactin in shoes, as well as cleaning her foot surfaces such as showers and bathroom floors with bleach.   Return if symptoms worsen or fail to improve.

## 2023-08-10 ENCOUNTER — Other Ambulatory Visit: Payer: Self-pay

## 2023-08-10 ENCOUNTER — Encounter: Payer: Self-pay | Admitting: Dermatology

## 2023-08-10 ENCOUNTER — Ambulatory Visit: Admitting: Dermatology

## 2023-08-10 DIAGNOSIS — L700 Acne vulgaris: Secondary | ICD-10-CM | POA: Diagnosis not present

## 2023-08-10 DIAGNOSIS — M47816 Spondylosis without myelopathy or radiculopathy, lumbar region: Secondary | ICD-10-CM | POA: Insufficient documentation

## 2023-08-10 DIAGNOSIS — L810 Postinflammatory hyperpigmentation: Secondary | ICD-10-CM

## 2023-08-10 DIAGNOSIS — S80862D Insect bite (nonvenomous), left lower leg, subsequent encounter: Secondary | ICD-10-CM

## 2023-08-10 DIAGNOSIS — W57XXXS Bitten or stung by nonvenomous insect and other nonvenomous arthropods, sequela: Secondary | ICD-10-CM

## 2023-08-10 DIAGNOSIS — S80861D Insect bite (nonvenomous), right lower leg, subsequent encounter: Secondary | ICD-10-CM | POA: Diagnosis not present

## 2023-08-10 DIAGNOSIS — L209 Atopic dermatitis, unspecified: Secondary | ICD-10-CM

## 2023-08-10 DIAGNOSIS — M48061 Spinal stenosis, lumbar region without neurogenic claudication: Secondary | ICD-10-CM | POA: Insufficient documentation

## 2023-08-10 MED ORDER — OPZELURA 1.5 % EX CREA
TOPICAL_CREAM | CUTANEOUS | 2 refills | Status: AC
  Filled 2023-08-10 – 2023-10-07 (×2): qty 60, 30d supply, fill #0

## 2023-08-10 MED ORDER — HYDROQUINONE 4 % EX CREA
TOPICAL_CREAM | Freq: Two times a day (BID) | CUTANEOUS | 2 refills | Status: DC
  Filled 2023-08-10 – 2023-10-07 (×3): qty 28.35, 30d supply, fill #0
  Filled 2023-10-30: qty 28.35, 15d supply, fill #0
  Filled 2023-11-09: qty 28.35, 14d supply, fill #0

## 2023-08-10 MED ORDER — TRETINOIN 0.025 % EX CREA
TOPICAL_CREAM | Freq: Every day | CUTANEOUS | 2 refills | Status: DC
  Filled 2023-08-10: qty 45, 30d supply, fill #0
  Filled 2023-09-04: qty 45, 30d supply, fill #1
  Filled 2023-10-07: qty 45, 30d supply, fill #2

## 2023-08-10 NOTE — Patient Instructions (Addendum)
 Treatment Plan for insect bites: Recommend sunscreen daily to affected areas.  Start hydroquinone  4% to affected areas at lower legs twice a day for 3 months then take 3 months off before restarting.   Treatment Plan for acne: Start tretinoin  0.025% cream at bedtime starting twice a week for a month. Increase by one night a week monthly until able to use every night.  Treatment Plan for eczema: Start Opzelura  twice daily to affected areas at left elbow, back.   Due to recent changes in healthcare laws, you may see results of your pathology and/or laboratory studies on MyChart before the doctors have had a chance to review them. We understand that in some cases there may be results that are confusing or concerning to you. Please understand that not all results are received at the same time and often the doctors may need to interpret multiple results in order to provide you with the best plan of care or course of treatment. Therefore, we ask that you please give us  2 business days to thoroughly review all your results before contacting the office for clarification. Should we see a critical lab result, you will be contacted sooner.   If You Need Anything After Your Visit  If you have any questions or concerns for your doctor, please call our main line at [phone] and press option 4 to reach your doctor's medical assistant. If no one answers, please leave a voicemail as directed and we will return your call as soon as possible. Messages left after 4 pm will be answered the following business day.   You may also send us  a message via MyChart. We typically respond to MyChart messages within 1-2 business days.  For prescription refills, please ask your pharmacy to contact our office. Our fax number is [phone].  If you have an urgent issue when the clinic is closed that cannot wait until the next business day, you can page your doctor at the number below.    Please note that while we do our  best to be available for urgent issues outside of office hours, we are not available 24/7.   If you have an urgent issue and are unable to reach us , you may choose to seek medical care at your doctor's office, retail clinic, urgent care center, or emergency room.  If you have a medical emergency, please immediately call 911 or go to the emergency department.  Pager Numbers  - Dr. Hester: [phone]  - Dr. Jackquline: [phone]  - Dr. Claudene: [phone]   In the event of inclement weather, please call our main line at [phone] for an update on the status of any delays or closures.  Dermatology Medication Tips: Please keep the boxes that topical medications come in in order to help keep track of the instructions about where and how to use these. Pharmacies typically print the medication instructions only on the boxes and not directly on the medication tubes.   If your medication is too expensive, please contact our office at [phone] option 4 or send us  a message through MyChart.   We are unable to tell what your co-pay for medications will be in advance as this is different depending on your insurance coverage. However, we may be able to find a substitute medication at lower cost or fill out paperwork to get insurance to cover a needed medication.   If a prior authorization is required to get your medication covered by your insurance company, please allow us  1-2 business  days to complete this process.  Drug prices often vary depending on where the prescription is filled and some pharmacies may offer cheaper prices.  The website www.goodrx.com contains coupons for medications through different pharmacies. The prices here do not account for what the cost may be with help from insurance (it may be cheaper with your insurance), but the website can give you the price if you did not use any insurance.  - You can print the associated coupon and take it with your prescription to the  pharmacy.  - You may also stop by our office during regular business hours and pick up a GoodRx coupon card.  - If you need your prescription sent electronically to a different pharmacy, notify our office through [facility] or by phone at [phone] option 4.     Si Usted Necesita Algo Despus de Su Visita  Tambin puede enviarnos un mensaje a travs de Clinical cytogeneticist. Por lo general respondemos a los mensajes de MyChart en el transcurso de 1 a 2 das hbiles.  Para renovar recetas, por favor pida a su farmacia que se ponga en contacto con nuestra oficina. Randi lakes de fax es [place] [phone].  Si tiene un asunto urgente cuando la clnica est cerrada y que no puede esperar hasta el siguiente da hbil, puede llamar/localizar a su doctor(a) al nmero que aparece a continuacin.   Por favor, tenga en cuenta que aunque hacemos todo lo posible para estar disponibles para asuntos urgentes fuera del horario de [place], no estamos disponibles las 24 horas del da, los 7 [address]  [address] de la [place].   Si tiene un problema urgente y no puede comunicarse con nosotros, puede optar por buscar atencin mdica  en el consultorio de su doctor(a), en una clnica privada, en un centro de atencin urgente o en una sala de emergencias.  Si tiene Engineer, drilling, por favor llame inmediatamente al 911 o vaya a la sala de emergencias.  Nmeros de bper  - Dr. Hester: [phone]  - Dra. Jackquline: [phone]  - Dr. Claudene: [phone]   En caso de inclemencias del tiempo, por favor llame a landry capes principal al [phone] para una actualizacin sobre el [place] de cualquier retraso o cierre.  Consejos para la medicacin en dermatologa: Por favor, guarde las cajas en las que vienen los medicamentos de uso tpico para ayudarle a seguir las instrucciones sobre dnde y cmo usarlos. Las farmacias generalmente imprimen las instrucciones del medicamento slo en las cajas y no directamente en los  tubos del [place].   Si su medicamento es muy caro, por favor, pngase en contacto con landry rieger llamando al [phone] y presione la opcin 4 o envenos un mensaje a travs de Clinical cytogeneticist.   No podemos decirle cul ser su copago por los medicamentos por adelantado ya que esto es diferente dependiendo de la cobertura de su seguro. Sin embargo, es posible que podamos encontrar un medicamento sustituto a Audiological scientist un formulario para que el seguro cubra el medicamento que se considera necesario.   Si se requiere una autorizacin previa para que su compaa de seguros malta su medicamento, por favor permtanos de 1 a 2 das hbiles para completar este proceso.  Los precios de los medicamentos varan con frecuencia dependiendo del Environmental consultant de dnde se surte la receta y alguna farmacias pueden ofrecer precios ms baratos.  El sitio web www.goodrx.com tiene cupones para medicamentos de Health and safety inspector. Los precios aqu no tienen en cuenta lo que podra costar con  la ayuda del seguro (puede ser ms barato con su seguro), pero el sitio web puede darle el precio si no Visual merchandiser.  - Puede imprimir el cupn correspondiente y llevarlo con su receta a la farmacia.  - Tambin puede pasar por nuestra oficina durante el horario de atencin regular y Education officer, museum una tarjeta de cupones de GoodRx.  - Si necesita que su receta se enve electrnicamente a una farmacia diferente, informe a nuestra oficina a travs de MyChart de [place] o por telfono llamando al [phone] y presione la opcin 4.

## 2023-08-10 NOTE — Progress Notes (Signed)
   New Patient Visit   Subjective  Ashley [name] is a 58 y.o. female who presents for the following: breaking out at back and face. Patient was given a rx by previous dermatologist to use at back, she thinks it could be DermaSmoothe. Currently uses Vaseline, aloe vera. She was also given a retinoid to use at face for acne.  Patient has also noticed dark spots at legs after insect bites.   The following portions of the chart were reviewed this encounter and updated as appropriate: medications, allergies, medical history  Review of Systems:  No other skin or systemic complaints except as noted in HPI or Assessment and Plan.  Objective  Well appearing patient in no apparent distress; mood and affect are within normal limits.   A focused examination was performed of the following areas: Back, face, arms, legs  Relevant exam findings are noted in the Assessment and Plan.    Assessment & Plan   Atopic dermatitis vs less likely psoriasis Exam: circular hyperpigmented scaly plaque with focal excoriation on Left elbow. Old healed linear excoriations and hyperpigmented linear macules on b/l upper back  Chronic flaring not at goal  Treatment Plan: Start Opzelura  twice daily to aa at left elbow, back.  Sample given.  Lot # [id number]  Exp: 09/19/2024 If not covered consider sending in tacrolimus.  ACNE VULGARIS Exam: hyperpigmented indurated inflamed papules on R mid cheek. PIH macules on cheeks. Lower face tends to flare per patient  Chronic flared not at goal  Treatment Plan: Start tretinoin  0.025% cream at bedtime starting twice a week for a month. Increase by one night a week monthly until able to use every night. 3 months for full benefit  No kidney problems.  INSECT BITE REACTION Exam postinflammatory hyperpigmented macules on legs, 2/2 arthropod bites  Treatment Plan: Recommend sunscreen daily to aa  Will self resolve but can treat to expedite. Risk of worsening with  overuse of hydroquinone  Start hydroquinone  4% to aa at lower legs twice a day for 3 months then take 3 months off before restarting.   ACNE VULGARIS   Related Medications tretinoin  (RETIN-A ) 0.025 % cream Apply topically at bedtime. As directed ATOPIC DERMATITIS, UNSPECIFIED TYPE   Related Medications Ruxolitinib  Phosphate (OPZELURA ) 1.5 % CREA Apply to affected area at back, left elbow twice daily for eczema. POSTINFLAMMATORY HYPERPIGMENTATION   Related Medications hydroquinone  4 % cream Apply topically 2 (two) times daily. For up to 3 months.  Return in about 3 months (around 11/10/2023) for acne, Eczema, with Dr. Claudene.  LILLETTE Lonell Drones, RMA, am acting as scribe for Boneta Claudene, MD .   Documentation: I have reviewed the above documentation for accuracy and completeness, and I agree with the above.  Boneta Claudene, MD

## 2023-08-11 ENCOUNTER — Other Ambulatory Visit: Payer: Self-pay

## 2023-09-04 ENCOUNTER — Other Ambulatory Visit: Payer: Self-pay

## 2023-09-07 ENCOUNTER — Other Ambulatory Visit: Payer: Self-pay

## 2023-09-07 MED ORDER — LOSARTAN POTASSIUM 100 MG PO TABS
100.0000 mg | ORAL_TABLET | Freq: Every day | ORAL | 3 refills | Status: AC
  Filled 2023-09-07: qty 90, 90d supply, fill #0
  Filled 2023-10-07 – 2024-01-19 (×2): qty 90, 90d supply, fill #1

## 2023-09-07 MED ORDER — VENLAFAXINE HCL ER 150 MG PO CP24
150.0000 mg | ORAL_CAPSULE | Freq: Every day | ORAL | 3 refills | Status: DC
  Filled 2023-09-07: qty 90, 90d supply, fill #0
  Filled 2023-10-07 – 2023-12-30 (×2): qty 90, 90d supply, fill #1

## 2023-09-07 MED ORDER — ATENOLOL 25 MG PO TABS
25.0000 mg | ORAL_TABLET | Freq: Every day | ORAL | 5 refills | Status: AC
  Filled 2023-09-07 – 2023-10-30 (×3): qty 90, 90d supply, fill #0

## 2023-09-07 MED ORDER — DOCUSATE SODIUM 100 MG PO CAPS
100.0000 mg | ORAL_CAPSULE | Freq: Two times a day (BID) | ORAL | 4 refills | Status: AC
  Filled 2023-10-07: qty 90, 45d supply, fill #0
  Filled 2023-12-03: qty 60, 30d supply, fill #1
  Filled 2023-12-23: qty 90, 45d supply, fill #1
  Filled 2024-01-19: qty 90, 45d supply, fill #2

## 2023-09-07 MED ORDER — ATORVASTATIN CALCIUM 40 MG PO TABS
40.0000 mg | ORAL_TABLET | Freq: Every day | ORAL | 3 refills | Status: DC
  Filled 2023-09-07 – 2023-10-07 (×2): qty 90, 90d supply, fill #0
  Filled 2024-01-19: qty 90, 90d supply, fill #1

## 2023-09-07 MED ORDER — HYDROCHLOROTHIAZIDE 25 MG PO TABS
25.0000 mg | ORAL_TABLET | Freq: Every day | ORAL | 3 refills | Status: DC
  Filled 2023-09-07 – 2023-11-09 (×3): qty 90, 90d supply, fill #0

## 2023-09-10 ENCOUNTER — Other Ambulatory Visit: Payer: Self-pay

## 2023-09-10 MED ORDER — LIDOCAINE 5 % EX PTCH
MEDICATED_PATCH | CUTANEOUS | 2 refills | Status: AC
  Filled 2023-09-10 – 2023-10-30 (×3): qty 30, 30d supply, fill #0
  Filled 2023-12-03 – 2023-12-30 (×2): qty 30, 30d supply, fill #1

## 2023-10-07 ENCOUNTER — Other Ambulatory Visit: Payer: Self-pay

## 2023-10-13 ENCOUNTER — Other Ambulatory Visit: Payer: Self-pay

## 2023-10-30 ENCOUNTER — Other Ambulatory Visit: Payer: Self-pay

## 2023-11-02 ENCOUNTER — Other Ambulatory Visit: Payer: Self-pay

## 2023-11-05 ENCOUNTER — Encounter: Payer: Self-pay | Admitting: Podiatry

## 2023-11-05 ENCOUNTER — Ambulatory Visit (INDEPENDENT_AMBULATORY_CARE_PROVIDER_SITE_OTHER): Admitting: Podiatry

## 2023-11-05 DIAGNOSIS — M79675 Pain in left toe(s): Secondary | ICD-10-CM | POA: Diagnosis not present

## 2023-11-05 DIAGNOSIS — B351 Tinea unguium: Secondary | ICD-10-CM | POA: Diagnosis not present

## 2023-11-05 DIAGNOSIS — M79674 Pain in right toe(s): Secondary | ICD-10-CM | POA: Diagnosis not present

## 2023-11-05 DIAGNOSIS — E669 Obesity, unspecified: Secondary | ICD-10-CM

## 2023-11-05 DIAGNOSIS — E119 Type 2 diabetes mellitus without complications: Secondary | ICD-10-CM | POA: Diagnosis not present

## 2023-11-05 DIAGNOSIS — E11621 Type 2 diabetes mellitus with foot ulcer: Secondary | ICD-10-CM

## 2023-11-09 ENCOUNTER — Ambulatory Visit: Admitting: Dermatology

## 2023-11-09 ENCOUNTER — Encounter: Payer: Self-pay | Admitting: Dermatology

## 2023-11-09 ENCOUNTER — Other Ambulatory Visit: Payer: Self-pay

## 2023-11-09 DIAGNOSIS — L810 Postinflammatory hyperpigmentation: Secondary | ICD-10-CM

## 2023-11-09 DIAGNOSIS — L700 Acne vulgaris: Secondary | ICD-10-CM | POA: Diagnosis not present

## 2023-11-09 DIAGNOSIS — L209 Atopic dermatitis, unspecified: Secondary | ICD-10-CM | POA: Diagnosis not present

## 2023-11-09 MED ORDER — TRIAMCINOLONE ACETONIDE 0.1 % EX CREA
TOPICAL_CREAM | CUTANEOUS | 2 refills | Status: AC
  Filled 2023-11-09: qty 80, 40d supply, fill #0
  Filled 2023-12-30: qty 80, 40d supply, fill #1

## 2023-11-09 MED ORDER — TRETINOIN 0.025 % EX CREA
TOPICAL_CREAM | Freq: Every day | CUTANEOUS | 2 refills | Status: AC
  Filled 2023-11-09: qty 45, 30d supply, fill #0
  Filled 2023-12-30: qty 45, 30d supply, fill #1
  Filled 2024-01-26: qty 45, 30d supply, fill #2

## 2023-11-09 NOTE — Progress Notes (Signed)
 Follow-Up Visit   Subjective  Ashley [name] is a 58 y.o. female who presents for the following: 3 month follow up acne. Using Tretinoin  0.025% cream as directed. States face has still not cleared up.  3 month follow up of atopic dermatitis. Back. States has not cleared up. Opzelura  was not covered so she is not using anything currently.   Check right foot. States she had a lesion on the top that has moved to the bottom. Has been seeing podiatrist for painful toenails secondary to onychomycosis. Has follow up appointment 11/18/2023.  The following portions of the chart were reviewed this encounter and updated as appropriate: medications, allergies, medical history  Review of Systems:  No other skin or systemic complaints except as noted in HPI or Assessment and Plan.  Objective  Well appearing patient in no apparent distress; mood and affect are within normal limits.  A focused examination was performed of the following areas: Face, back, arms, right foot.  Relevant exam findings are noted in the Assessment and Plan.    Assessment & Plan   ACNE VULGARIS Exam: PIH macules and few open comedones R cheek, overall improved   Chronic and persistent condition with duration or expected duration over one year. Condition is improving with treatment but not currently at goal.   Treatment Plan: Continue Tretinoin  0.025% cream to face at bedtime, wash off in morning.   Topical retinoid medications like tretinoin /Retin-A , adapalene/Differin, tazarotene/Fabior, and Epiduo/Epiduo Forte can cause dryness and irritation when first started. Only apply a pea-sized amount to the entire affected area. Avoid applying it around the eyes, edges of mouth and creases at the nose. If you experience irritation, use a good moisturizer first and/or apply the medicine less often. If you are doing well with the medicine, you can increase how often you use it until you are applying every night. Be careful with  sun protection while using this medication as it can make you sensitive to the sun. This medicine should not be used by pregnant women.    ATOPIC DERMATITIS Exam: hyperpigmented patches at R dorsal foot elbows and right upper back. Scaly plaque at R plantar foot. < 1% BSA  Chronic and persistent condition with duration or expected duration over one year. Condition is bothersome/symptomatic for patient. Currently flared.   Atopic dermatitis (eczema) is a chronic, relapsing, pruritic condition that can significantly affect quality of life. It is often associated with allergic rhinitis and/or asthma and can require treatment with topical medications, phototherapy, or in severe cases biologic injectable medication (Dupixent; Adbry) or Oral JAK inhibitors.  Treatment Plan: Start Triamcinolone cream twice a day until rash has improved on upper back, elbow and foot. Then use as needed for eczema flares. Avoid applying to face, groin, and axilla. Use as directed. Long-term use can cause thinning of the skin.  Topical steroids (such as triamcinolone, fluocinolone, fluocinonide, mometasone, clobetasol, halobetasol, betamethasone, hydrocortisone ) can cause thinning and lightening of the skin if they are used for too long in the same area. Your physician has selected the right strength medicine for your problem and area affected on the body. Please use your medication only as directed by your physician to prevent side effects.    Recommend gentle skin care.    ACNE VULGARIS   Related Medications tretinoin  (RETIN-A ) 0.025 % cream Apply topically at bedtime. As directed ATOPIC DERMATITIS, UNSPECIFIED TYPE   Related Medications Ruxolitinib  Phosphate (OPZELURA ) 1.5 % CREA Apply to affected area at back, left elbow twice  daily for eczema. POSTINFLAMMATORY HYPERPIGMENTATION   Related Medications hydroquinone  4 % cream Apply topically 2 (two) times daily. For up to 3 months.  Return in about 3  months (around 02/09/2024) for Atopic Dermatitis Follow Up, Acne Follow Up.  I, Kate Fought, CMA, am acting as scribe for Boneta Sharps, MD.   Documentation: I have reviewed the above documentation for accuracy and completeness, and I agree with the above.  Boneta Sharps, MD

## 2023-11-09 NOTE — Progress Notes (Signed)
  Subjective:  Patient ID: Ashley [name], female    DOB: 1965-06-29,  MRN: [id number]  Ashley [name] presents to clinic today for preventative diabetic foot care and painful thick toenails that are difficult to trim. Pain interferes with ambulation. Aggravating factors include wearing enclosed shoe gear. Pain is relieved with periodic professional debridement.  Chief Complaint  Patient presents with   Toe Pain    Diabetic foot care. Her PCP is Dr. Marsa. Her last visit was in  Sept. She has callouses that she says are hurting her.  A1c is 6.4   New problem(s): None.   PCP is Ashley Reyes Lenis, MD.  Allergies  Allergen Reactions   Metformin  Nausea And Vomiting and Nausea Only    2022 documented by pcp   Penicillins Hives, Other (See Comments) and Rash   Metformin  And Related Diarrhea   Prednisone  Hives and Swelling    Review of Systems: Negative except as noted in the HPI.  Objective: No changes noted in today's physical examination. There were no vitals filed for this visit. Ashley [name] is a pleasant 58 y.o. female in NAD. AAO x 3.  Vascular Examination: Capillary refill time immediate b/l. Palpable pedal pulses. Pedal hair present b/l. Pedal edema trace b/l. No pain with calf compression b/l. Skin temperature gradient WNL b/l. No cyanosis or clubbing b/l. No ischemia or gangrene noted b/l.   Neurological Examination: Sensation grossly intact b/l with 10 gram monofilament. Vibratory sensation intact b/l.   Dermatological Examination: Pedal skin with normal turgor, texture and tone b/l.  No open wounds. No interdigital macerations.   Toenails 1-5 b/l thick, discolored, elongated with subungual debris and pain on dorsal palpation.   Musculoskeletal Examination: Muscle strength 5/5 to all lower extremity muscle groups bilaterally. Hammertoe(s) b/l 5th toes.  Radiographs: None  Assessment/Plan: 1. Pain due to onychomycosis of toenails of both feet   2.  Type 2 diabetes mellitus in patient with obesity [facility])     Patient was evaluated and treated. All patient's and/or POA's questions/concerns addressed on today's visit. Toenails 1-5 b/l debrided in length and girth without incident. Continue foot and shoe inspections daily. Monitor blood glucose per PCP/Endocrinologist's recommendations. Continue soft, supportive shoe gear daily. Report any pedal injuries to medical professional. Call office if there are any questions/concerns. -Patient/POA to call should there be question/concern in the interim.   Return in about 3 months (around 02/05/2024).  Ashley [name], DPM      [place] LOCATION: [address] [place] [address]                   Office [phone]   [facility] LOCATION: [address] [place], [place] [address] Office [phone]

## 2023-11-09 NOTE — Patient Instructions (Addendum)
 Start Triamcinolone cream twice a day until rash has improved on upper back, elbow and foot. Then use as needed for eczema flares. Avoid applying to face, groin, and axilla. Use as directed. Long-term use can cause thinning of the skin.  Topical steroids (such as triamcinolone, fluocinolone, fluocinonide, mometasone, clobetasol, halobetasol, betamethasone, hydrocortisone ) can cause thinning and lightening of the skin if they are used for too long in the same area. Your physician has selected the right strength medicine for your problem and area affected on the body. Please use your medication only as directed by your physician to prevent side effects.     Continue Tretinoin  0.025% cream to face at bedtime, wash off in morning.   Topical retinoid medications like tretinoin /Retin-A , adapalene/Differin, tazarotene/Fabior, and Epiduo/Epiduo Forte can cause dryness and irritation when first started. Only apply a pea-sized amount to the entire affected area. Avoid applying it around the eyes, edges of mouth and creases at the nose. If you experience irritation, use a good moisturizer first and/or apply the medicine less often. If you are doing well with the medicine, you can increase how often you use it until you are applying every night. Be careful with sun protection while using this medication as it can make you sensitive to the sun. This medicine should not be used by pregnant women.    Recommend daily broad spectrum sunscreen SPF 30+ to sun-exposed areas, reapply every 2 hours as needed. Call for new or changing lesions.  Staying in the shade or wearing long sleeves, sun glasses (UVA+UVB protection) and wide brim hats (4-inch brim around the entire circumference of the hat) are also recommended for sun protection.      Due to recent changes in healthcare laws, you may see results of your pathology and/or laboratory studies on MyChart before the doctors have had a chance to review them. We understand  that in some cases there may be results that are confusing or concerning to you. Please understand that not all results are received at the same time and often the doctors may need to interpret multiple results in order to provide you with the best plan of care or course of treatment. Therefore, we ask that you please give us  2 business days to thoroughly review all your results before contacting the office for clarification. Should we see a critical lab result, you will be contacted sooner.   If You Need Anything After Your Visit  If you have any questions or concerns for your doctor, please call our main line at [phone] and press option 4 to reach your doctor's medical assistant. If no one answers, please leave a voicemail as directed and we will return your call as soon as possible. Messages left after 4 pm will be answered the following business day.   You may also send us  a message via MyChart. We typically respond to MyChart messages within 1-2 business days.  For prescription refills, please ask your pharmacy to contact our office. Our fax number is [phone].  If you have an urgent issue when the clinic is closed that cannot wait until the next business day, you can page your doctor at the number below.    Please note that while we do our best to be available for urgent issues outside of office hours, we are not available 24/7.   If you have an urgent issue and are unable to reach us , you may choose to seek medical care at your doctor's office, retail clinic, urgent care  center, or emergency room.  If you have a medical emergency, please immediately call 911 or go to the emergency department.  Pager Numbers  - Dr. Hester: [phone]  - Dr. Jackquline: [phone]  - Dr. Claudene: [phone]   - Dr. Raymund: [phone]  In the event of inclement weather, please call our main line at [phone] for an update on the status of any delays or closures.  Dermatology  Medication Tips: Please keep the boxes that topical medications come in in order to help keep track of the instructions about where and how to use these. Pharmacies typically print the medication instructions only on the boxes and not directly on the medication tubes.   If your medication is too expensive, please contact our office at [phone] option 4 or send us  a message through MyChart.   We are unable to tell what your co-pay for medications will be in advance as this is different depending on your insurance coverage. However, we may be able to find a substitute medication at lower cost or fill out paperwork to get insurance to cover a needed medication.   If a prior authorization is required to get your medication covered by your insurance company, please allow us  1-2 business days to complete this process.  Drug prices often vary depending on where the prescription is filled and some pharmacies may offer cheaper prices.  The website www.goodrx.com contains coupons for medications through different pharmacies. The prices here do not account for what the cost may be with help from insurance (it may be cheaper with your insurance), but the website can give you the price if you did not use any insurance.  - You can print the associated coupon and take it with your prescription to the pharmacy.  - You may also stop by our office during regular business hours and pick up a GoodRx coupon card.  - If you need your prescription sent electronically to a different pharmacy, notify our office through [facility] or by phone at [phone] option 4.     Si Usted Necesita Algo Despus de Su Visita  Tambin puede enviarnos un mensaje a travs de Clinical cytogeneticist. Por lo general respondemos a los mensajes de MyChart en el transcurso de 1 a 2 das hbiles.  Para renovar recetas, por favor pida a su farmacia que se ponga en contacto con nuestra oficina. Randi lakes de fax es [place] [phone].  Si  tiene un asunto urgente cuando la clnica est cerrada y que no puede esperar hasta el siguiente da hbil, puede llamar/localizar a su doctor(a) al nmero que aparece a continuacin.   Por favor, tenga en cuenta que aunque hacemos todo lo posible para estar disponibles para asuntos urgentes fuera del horario de [place], no estamos disponibles las 24 horas del da, los 7 [address]  [address] de la [place].   Si tiene un problema urgente y no puede comunicarse con nosotros, puede optar por buscar atencin mdica  en el consultorio de su doctor(a), en una clnica privada, en un centro de atencin urgente o en una sala de emergencias.  Si tiene Engineer, drilling, por favor llame inmediatamente al 911 o vaya a la sala de emergencias.  Nmeros de bper  - Dr. Hester: [phone]  - Dra. Jackquline: [phone]  - Dr. Claudene: [phone]  - Dra. Kitts: [phone]  En caso de inclemencias del [place], por favor llame a nuestra lnea principal al [phone] para una actualizacin sobre el estado de cualquier retraso o cierre.  Consejos para  la medicacin en dermatologa: Por favor, guarde las cajas en las que vienen los medicamentos de uso tpico para ayudarle a seguir las instrucciones sobre dnde y cmo usarlos. Las farmacias generalmente imprimen las instrucciones del medicamento slo en las cajas y no directamente en los tubos del [place].   Si su medicamento es muy caro, por favor, pngase en contacto con landry rieger llamando al [phone] y presione la opcin 4 o envenos un mensaje a travs de Clinical cytogeneticist.   No podemos decirle cul ser su copago por los medicamentos por adelantado ya que esto es diferente dependiendo de la cobertura de su seguro. Sin embargo, es posible que podamos encontrar un medicamento sustituto a Audiological scientist un formulario para que el seguro cubra el medicamento que se considera necesario.   Si se requiere una autorizacin previa para que su compaa de seguros  malta su medicamento, por favor permtanos de 1 a 2 das hbiles para completar este proceso.  Los precios de los medicamentos varan con frecuencia dependiendo del Environmental consultant de dnde se surte la receta y alguna farmacias pueden ofrecer precios ms baratos.  El sitio web www.goodrx.com tiene cupones para medicamentos de Health and safety inspector. Los precios aqu no tienen en cuenta lo que podra costar con la ayuda del seguro (puede ser ms barato con su seguro), pero el sitio web puede darle el precio si no utiliz Tourist information centre manager.  - Puede imprimir el cupn correspondiente y llevarlo con su receta a la farmacia.  - Tambin puede pasar por nuestra oficina durante el horario de atencin regular y Education officer, museum una tarjeta de cupones de GoodRx.  - Si necesita que su receta se enve electrnicamente a una farmacia diferente, informe a nuestra oficina a travs de MyChart de [place] o por telfono llamando al [phone] y presione la opcin 4.

## 2023-11-10 ENCOUNTER — Other Ambulatory Visit: Payer: Self-pay

## 2023-11-18 ENCOUNTER — Ambulatory Visit: Admitting: Podiatry

## 2023-12-07 ENCOUNTER — Encounter: Payer: Self-pay | Admitting: Dermatology

## 2023-12-16 ENCOUNTER — Other Ambulatory Visit (HOSPITAL_COMMUNITY): Payer: Self-pay

## 2023-12-16 ENCOUNTER — Other Ambulatory Visit: Payer: Self-pay

## 2023-12-18 ENCOUNTER — Other Ambulatory Visit: Payer: Self-pay

## 2023-12-23 ENCOUNTER — Other Ambulatory Visit: Payer: Self-pay

## 2023-12-23 ENCOUNTER — Other Ambulatory Visit: Payer: Self-pay | Admitting: Podiatry

## 2023-12-23 MED ORDER — KETOCONAZOLE 2 % EX CREA
1.0000 | TOPICAL_CREAM | Freq: Two times a day (BID) | CUTANEOUS | 0 refills | Status: AC
  Filled 2023-12-23: qty 15, 30d supply, fill #0
  Filled 2024-01-19: qty 30, 30d supply, fill #1

## 2023-12-30 ENCOUNTER — Other Ambulatory Visit: Payer: Self-pay

## 2023-12-30 MED ORDER — OZEMPIC (0.25 OR 0.5 MG/DOSE) 2 MG/3ML ~~LOC~~ SOPN
PEN_INJECTOR | SUBCUTANEOUS | 0 refills | Status: DC
  Filled 2023-12-30 – 2024-01-01 (×2): qty 3, 56d supply, fill #0

## 2023-12-31 ENCOUNTER — Other Ambulatory Visit: Payer: Self-pay

## 2024-01-01 ENCOUNTER — Other Ambulatory Visit (HOSPITAL_COMMUNITY): Payer: Self-pay

## 2024-01-01 ENCOUNTER — Other Ambulatory Visit: Payer: Self-pay

## 2024-01-04 ENCOUNTER — Other Ambulatory Visit: Payer: Self-pay

## 2024-01-19 ENCOUNTER — Other Ambulatory Visit (HOSPITAL_COMMUNITY): Payer: Self-pay

## 2024-01-20 ENCOUNTER — Other Ambulatory Visit (HOSPITAL_COMMUNITY): Payer: Self-pay

## 2024-01-20 ENCOUNTER — Other Ambulatory Visit: Payer: Self-pay

## 2024-01-20 ENCOUNTER — Encounter: Payer: Self-pay | Admitting: Pharmacist

## 2024-01-20 MED ORDER — OZEMPIC (0.25 OR 0.5 MG/DOSE) 2 MG/3ML ~~LOC~~ SOPN
0.5000 mg | PEN_INJECTOR | SUBCUTANEOUS | 1 refills | Status: AC
  Filled 2024-01-20 – 2024-01-26 (×3): qty 9, 84d supply, fill #0

## 2024-01-22 ENCOUNTER — Other Ambulatory Visit (HOSPITAL_COMMUNITY): Payer: Self-pay

## 2024-01-22 ENCOUNTER — Other Ambulatory Visit: Payer: Self-pay

## 2024-01-25 ENCOUNTER — Encounter: Payer: Self-pay | Admitting: Pharmacist

## 2024-01-25 ENCOUNTER — Other Ambulatory Visit: Payer: Self-pay

## 2024-01-25 ENCOUNTER — Other Ambulatory Visit (HOSPITAL_COMMUNITY): Payer: Self-pay

## 2024-01-26 ENCOUNTER — Other Ambulatory Visit (HOSPITAL_COMMUNITY): Payer: Self-pay

## 2024-01-27 ENCOUNTER — Other Ambulatory Visit (HOSPITAL_COMMUNITY): Payer: Self-pay

## 2024-01-27 ENCOUNTER — Other Ambulatory Visit: Payer: Self-pay

## 2024-01-28 ENCOUNTER — Encounter: Payer: Self-pay | Admitting: Neurology

## 2024-01-30 ENCOUNTER — Encounter: Payer: Self-pay | Admitting: Neurology

## 2024-02-05 ENCOUNTER — Other Ambulatory Visit: Payer: Self-pay

## 2024-02-05 ENCOUNTER — Other Ambulatory Visit (HOSPITAL_COMMUNITY): Payer: Self-pay

## 2024-02-05 MED ORDER — POLYETHYLENE GLYCOL 3350 17 GM/SCOOP PO POWD
ORAL | 0 refills | Status: DC
  Filled 2024-02-05: qty 238, 14d supply, fill #0

## 2024-02-05 MED ORDER — BISACODYL EC 5 MG PO TBEC
DELAYED_RELEASE_TABLET | ORAL | 0 refills | Status: AC
  Filled 2024-02-05: qty 4, 1d supply, fill #0

## 2024-02-08 ENCOUNTER — Ambulatory Visit: Admitting: Dermatology

## 2024-02-08 ENCOUNTER — Other Ambulatory Visit (HOSPITAL_COMMUNITY): Payer: Self-pay

## 2024-02-08 ENCOUNTER — Ambulatory Visit: Admitting: Podiatry

## 2024-02-08 ENCOUNTER — Encounter: Payer: Self-pay | Admitting: Dermatology

## 2024-02-08 DIAGNOSIS — L649 Androgenic alopecia, unspecified: Secondary | ICD-10-CM

## 2024-02-08 DIAGNOSIS — L658 Other specified nonscarring hair loss: Secondary | ICD-10-CM

## 2024-02-08 DIAGNOSIS — L578 Other skin changes due to chronic exposure to nonionizing radiation: Secondary | ICD-10-CM

## 2024-02-08 DIAGNOSIS — L209 Atopic dermatitis, unspecified: Secondary | ICD-10-CM

## 2024-02-08 DIAGNOSIS — Z79899 Other long term (current) drug therapy: Secondary | ICD-10-CM

## 2024-02-08 DIAGNOSIS — L988 Other specified disorders of the skin and subcutaneous tissue: Secondary | ICD-10-CM | POA: Diagnosis not present

## 2024-02-08 DIAGNOSIS — L700 Acne vulgaris: Secondary | ICD-10-CM | POA: Diagnosis not present

## 2024-02-08 NOTE — Progress Notes (Signed)
 "  Follow-Up Visit   Subjective  Ashley [name] is a 59 y.o. female who presents for the following: hair loss. States she needs some type of pill or topical for hair loss. Thinks BP medication was causing thinning.   Would like a firming cream for skin on neck. Collagen oral or topical.  States her sister uses a prescription firming cream.   3 month acne follow up. Face. Using tretinoin  0.025% cream as directed.   3 month atopic dermatitis follow up. Back and feet. Using Triamcinolone  cream twice a day as needed for flares.   The following portions of the chart were reviewed this encounter and updated as appropriate: medications, allergies, medical history  Review of Systems:  No other skin or systemic complaints except as noted in HPI or Assessment and Plan.  Objective  Well appearing patient in no apparent distress; mood and affect are within normal limits.  A focused examination was performed of the following areas: Scalp, face, neck   Relevant exam findings are noted in the Assessment and Plan.    Assessment & Plan   ACNE VULGARIS Exam: clear   Chronic and persistent condition with duration or expected duration over one year. Condition is improving with treatment but not currently at goal.     Treatment Plan: Continue Tretinoin  0.025% cream to face at bedtime, wash off in morning.    Topical retinoid medications like tretinoin /Retin-A , adapalene/Differin, tazarotene/Fabior, and Epiduo/Epiduo Forte can cause dryness and irritation when first started. Only apply a pea-sized amount to the entire affected area. Avoid applying it around the eyes, edges of mouth and creases at the nose. If you experience irritation, use a good moisturizer first and/or apply the medicine less often. If you are doing well with the medicine, you can increase how often you use it until you are applying every night. Be careful with sun protection while using this medication as it can make you  sensitive to the sun. This medicine should not be used by pregnant women.    ATOPIC DERMATITIS Exam: hyperpigmented patches and xerosis on dorsal hands/fingers 2% BSA   Chronic and persistent condition with duration or expected duration over one year. Condition is bothersome/symptomatic for patient. Currently flared.     Atopic dermatitis (eczema) is a chronic, relapsing, pruritic condition that can significantly affect quality of life. It is often associated with allergic rhinitis and/or asthma and can require treatment with topical medications, phototherapy, or in severe cases biologic injectable medication (Dupixent; Adbry) or Oral JAK inhibitors.   Treatment Plan: Continue Triamcinolone  cream twice a day until rash has improved on upper back, elbow and foot. Then use as needed for eczema flares. Avoid applying to face, groin, and axilla. Use as directed. Long-term use can cause thinning of the skin.   Topical steroids (such as triamcinolone , fluocinolone, fluocinonide, mometasone, clobetasol, halobetasol, betamethasone, hydrocortisone ) can cause thinning and lightening of the skin if they are used for too long in the same area. Your physician has selected the right strength medicine for your problem and area affected on the body. Please use your medication only as directed by your physician to prevent side effects.      Recommend gentle skin care.  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 Condition is bothersome/symptomatic for patient. Currently flared.   Female Androgenic Alopecia is a chronic condition related to genetics and/or hormonal changes.  In women androgenetic alopecia is commonly associated with menopause but may occur any time after puberty.  It causes hair thinning primarily on the crown with widening of the part and temporal hairline  recession.  Can use OTC Rogaine (minoxidil) 5% solution/foam as directed.  Oral treatments in female patients who have no contraindication may include : - Low dose oral minoxidil 1.25 - 5mg  daily - Spironolactone 50 - 100mg  bid - Finasteride 2.5 - 5 mg daily Adjunctive therapies include: - Low Level Laser Light Therapy (LLLT) - Platelet-rich plasma injections (PRP) - Hair Transplants or scalp reduction   Treatment Plan:  Recommend minoxidil 5% (Rogaine for men) solution or foam to be applied to the scalp and left in. This should ideally be used twice daily for best results but it helps with hair regrowth when used at least three times per week. Rogaine initially can cause increased hair shedding for the first few weeks but this will stop with continued use. In studies, people who used minoxidil (Rogaine) for at least 6 months had thicker hair than people who did not. Minoxidil topical (Rogaine) only works as long as it continues to be used. If if it is no longer used then the hair it has been helping to regrow can fall out. Minoxidil topical (Rogaine) can cause increased facial hair growth which can usually be managed easily with a battery-operated hair trimmer. If facial hair growth is bothersome, switching to the 2% women's version can decrease the risk of unwanted facial hair growth.   Do not recommend oral Minoxidil due to current BP medications prescribed from PCP. Recommend topical minoxidil.    atenolol  may be associated with hair loss. Do not change medications before discussing a new plan with PCP.  Long term medication management.  Patient is using long term (months to years) prescription medication  to control their dermatologic condition.  These medications require periodic monitoring to evaluate for efficacy and side effects and may require periodic laboratory monitoring.    FACIAL ELASTOSIS Exam: Rhytides and volume loss.  Treatment Plan: Start naturium vitamin C  serum  Patient would like to discuss Botox. Would like insurance to cover. Advised insurance will not cover unless for hyperhidrosis of certain sites.   Recommend daily broad spectrum sunscreen SPF 30+ to sun-exposed areas, reapply every 2 hours as needed. Call for new or changing lesions.  Staying in the shade or wearing long sleeves, sun glasses (UVA+UVB protection) and wide brim hats (4-inch brim around the entire circumference of the hat) are also recommended for sun protection.   ACNE VULGARIS   Existing Treatments - tretinoin  (RETIN-A ) 0.025 % cream - Apply topically at bedtime. As directed ATOPIC DERMATITIS, UNSPECIFIED TYPE   Existing Treatments - Ruxolitinib  Phosphate (OPZELURA ) 1.5 % CREA - Apply to affected area at back, left elbow twice daily for eczema. FEMALE PATTERN HAIR LOSS   ACTINIC ELASTOSIS    Return if symptoms worsen or fail to improve.  I, Jill Parcell, CMA, am acting as scribe for Boneta Sharps, MD.   Documentation: I have reviewed the above documentation for accuracy and completeness, and I agree with the above.  Boneta Sharps, MD    "

## 2024-02-08 NOTE — Patient Instructions (Addendum)
 Discuss changing atenolol  with PCP. Does have potential to cause hair thinning.      Recommend CeraVe moisturizing cream for body, hands, feet.   Continue Triamcinolone  cream twice a day until rash has improved on upper back, elbow and foot. Then use as needed for eczema flares. Avoid applying to face, groin, and axilla. Use as directed. Long-term use can cause thinning of the skin.   Topical steroids (such as triamcinolone , fluocinolone, fluocinonide, mometasone, clobetasol, halobetasol, betamethasone, hydrocortisone ) can cause thinning and lightening of the skin if they are used for too long in the same area. Your physician has selected the right strength medicine for your problem and area affected on the body. Please use your medication only as directed by your physician to prevent side effects.     Continue Tretinoin  0.025% cream to face at bedtime, wash off in morning.    Topical retinoid medications like tretinoin /Retin-A , adapalene/Differin, tazarotene/Fabior, and Epiduo/Epiduo Forte can cause dryness and irritation when first started. Only apply a pea-sized amount to the entire affected area. Avoid applying it around the eyes, edges of mouth and creases at the nose. If you experience irritation, use a good moisturizer first and/or apply the medicine less often. If you are doing well with the medicine, you can increase how often you use it until you are applying every night. Be careful with sun protection while using this medication as it can make you sensitive to the sun. This medicine should not be used by pregnant women.     Recommend minoxidil 5% (Rogaine for men) solution or foam to be applied to the scalp and left in. This should ideally be used twice daily for best results but it helps with hair regrowth when used at least three times per week. Rogaine initially can cause increased hair shedding for the first few weeks but this will stop with continued use. In studies, people who  used minoxidil (Rogaine) for at least 6 months had thicker hair than people who did not. Minoxidil topical (Rogaine) only works as long as it continues to be used. If if it is no longer used then the hair it has been helping to regrow can fall out. Minoxidil topical (Rogaine) can cause increased facial hair growth which can usually be managed easily with a battery-operated hair trimmer. If facial hair growth is bothersome, switching to the 2% women's version can decrease the risk of unwanted facial hair growth.    Due to recent changes in healthcare laws, you may see results of your pathology and/or laboratory studies on MyChart before the doctors have had a chance to review them. We understand that in some cases there may be results that are confusing or concerning to you. Please understand that not all results are received at the same time and often the doctors may need to interpret multiple results in order to provide you with the best plan of care or course of treatment. Therefore, we ask that you please give us  2 business days to thoroughly review all your results before contacting the office for clarification. Should we see a critical lab result, you will be contacted sooner.   If You Need Anything After Your Visit  If you have any questions or concerns for your doctor, please call our main line at [phone] and press option 4 to reach your doctor's medical assistant. If no one answers, please leave a voicemail as directed and we will return your call as soon as possible. Messages left after 4 pm will  be answered the following business day.   You may also send us  a message via MyChart. We typically respond to MyChart messages within 1-2 business days.  For prescription refills, please ask your pharmacy to contact our office. Our fax number is [phone].  If you have an urgent issue when the clinic is closed that cannot wait until the next business day, you can page your doctor at the number  below.    Please note that while we do our best to be available for urgent issues outside of office hours, we are not available 24/7.   If you have an urgent issue and are unable to reach us , you may choose to seek medical care at your doctor's office, retail clinic, urgent care center, or emergency room.  If you have a medical emergency, please immediately call 911 or go to the emergency department.  Pager Numbers  - Dr. Hester: [phone]  - Dr. Jackquline: [phone]  - Dr. Claudene: [phone]   - Dr. Raymund: [phone]  In the event of inclement weather, please call our main line at [phone] for an update on the status of any delays or closures.  Dermatology Medication Tips: Please keep the boxes that topical medications come in in order to help keep track of the instructions about where and how to use these. Pharmacies typically print the medication instructions only on the boxes and not directly on the medication tubes.   If your medication is too expensive, please contact our office at [phone] option 4 or send us  a message through MyChart.   We are unable to tell what your co-pay for medications will be in advance as this is different depending on your insurance coverage. However, we may be able to find a substitute medication at lower cost or fill out paperwork to get insurance to cover a needed medication.   If a prior authorization is required to get your medication covered by your insurance company, please allow us  1-2 business days to complete this process.  Drug prices often vary depending on where the prescription is filled and some pharmacies may offer cheaper prices.  The website www.goodrx.com contains coupons for medications through different pharmacies. The prices here do not account for what the cost may be with help from insurance (it may be cheaper with your insurance), but the website can give you the price if you did not use any insurance.  - You  can print the associated coupon and take it with your prescription to the pharmacy.  - You may also stop by our office during regular business hours and pick up a GoodRx coupon card.  - If you need your prescription sent electronically to a different pharmacy, notify our office through [facility] or by phone at [phone] option 4.     Si Usted Necesita Algo Despus de Su Visita  Tambin puede enviarnos un mensaje a travs de Clinical Cytogeneticist. Por lo general respondemos a los mensajes de MyChart en el transcurso de 1 a 2 das hbiles.  Para renovar recetas, por favor pida a su farmacia que se ponga en contacto con nuestra oficina. Randi lakes de fax es [place] [phone].  Si tiene un asunto urgente cuando la clnica est cerrada y que no puede esperar hasta el siguiente da hbil, puede llamar/localizar a su doctor(a) al nmero que aparece a continuacin.   Por favor, tenga en cuenta que aunque hacemos todo lo posible para estar disponibles para asuntos urgentes fuera del horario de [place], no  estamos disponibles las 24 horas del da, los 7 [address]  [address] de la [place].   Si tiene un problema urgente y no puede comunicarse con nosotros, puede optar por buscar atencin mdica  en el consultorio de su doctor(a), en una clnica privada, en un centro de atencin urgente o en una sala de emergencias.  Si tiene engineer, drilling, por favor llame inmediatamente al 911 o vaya a la sala de emergencias.  Nmeros de bper  - Dr. Hester: [phone]  - Dra. Jackquline: [phone]  - Dr. Claudene: [phone]  - Dra. Kitts: [phone]  En caso de inclemencias del [place], por favor llame a nuestra lnea principal al [phone] para una actualizacin sobre el estado de cualquier retraso o cierre.  Consejos para la medicacin en dermatologa: Por favor, guarde las cajas en las que vienen los medicamentos de uso tpico para ayudarle a seguir las instrucciones sobre dnde y cmo usarlos. Las  farmacias generalmente imprimen las instrucciones del medicamento slo en las cajas y no directamente en los tubos del [place].   Si su medicamento es muy caro, por favor, pngase en contacto con landry rieger llamando al [phone] y presione la opcin 4 o envenos un mensaje a travs de Clinical Cytogeneticist.   No podemos decirle cul ser su copago por los medicamentos por adelantado ya que esto es diferente dependiendo de la cobertura de su seguro. Sin embargo, es posible que podamos encontrar un medicamento sustituto a audiological scientist un formulario para que el seguro cubra el medicamento que se considera necesario.   Si se requiere una autorizacin previa para que su compaa de seguros cubra su medicamento, por favor permtanos de 1 a 2 das hbiles para completar este proceso.  Los precios de los medicamentos varan con frecuencia dependiendo del environmental consultant de dnde se surte la receta y alguna farmacias pueden ofrecer precios ms baratos.  El sitio web www.goodrx.com tiene cupones para medicamentos de health and safety inspector. Los precios aqu no tienen en cuenta lo que podra costar con la ayuda del seguro (puede ser ms barato con su seguro), pero el sitio web puede darle el precio si no utiliz tourist information centre manager.  - Puede imprimir el cupn correspondiente y llevarlo con su receta a la farmacia.  - Tambin puede pasar por nuestra oficina durante el horario de atencin regular y education officer, museum una tarjeta de cupones de GoodRx.  - Si necesita que su receta se enve electrnicamente a una farmacia diferente, informe a nuestra oficina a travs de MyChart de [place] o por telfono llamando al [phone] y presione la opcin 4.

## 2024-02-10 ENCOUNTER — Other Ambulatory Visit (HOSPITAL_COMMUNITY): Payer: Self-pay

## 2024-02-10 ENCOUNTER — Other Ambulatory Visit: Payer: Self-pay

## 2024-02-10 MED ORDER — SUMATRIPTAN SUCCINATE 50 MG PO TABS
50.0000 mg | ORAL_TABLET | ORAL | 11 refills | Status: AC
  Filled 2024-02-10: qty 9, 30d supply, fill #0

## 2024-02-10 MED ORDER — VITAMIN B-12 1000 MCG PO TABS: 1000.0000 ug | ORAL_TABLET | Freq: Every day | ORAL | 4 refills | Status: AC

## 2024-02-11 ENCOUNTER — Other Ambulatory Visit: Payer: Self-pay

## 2024-02-11 ENCOUNTER — Other Ambulatory Visit (HOSPITAL_COMMUNITY): Payer: Self-pay

## 2024-02-12 ENCOUNTER — Other Ambulatory Visit (HOSPITAL_COMMUNITY): Payer: Self-pay

## 2024-02-15 ENCOUNTER — Other Ambulatory Visit: Payer: Self-pay

## 2024-02-15 ENCOUNTER — Ambulatory Visit: Admitting: Podiatry

## 2024-03-02 ENCOUNTER — Ambulatory Visit: Admitting: Podiatry

## 2024-03-10 ENCOUNTER — Encounter: Admitting: Dietician

## 2024-03-22 ENCOUNTER — Ambulatory Visit: Payer: Self-pay | Admitting: Neurology
